# Patient Record
Sex: Female | Born: 1985 | Race: White | Hispanic: No | Smoking: Former smoker
Health system: Southern US, Community
[De-identification: ages and names within clinical notes are randomized; demographics above are authoritative.]

## PROBLEM LIST (undated history)

## (undated) DIAGNOSIS — O039 Complete or unspecified spontaneous abortion without complication: Secondary | ICD-10-CM

## (undated) DIAGNOSIS — F419 Anxiety disorder, unspecified: Secondary | ICD-10-CM

## (undated) DIAGNOSIS — G43909 Migraine, unspecified, not intractable, without status migrainosus: Secondary | ICD-10-CM

## (undated) DIAGNOSIS — N760 Acute vaginitis: Secondary | ICD-10-CM

## (undated) DIAGNOSIS — R112 Nausea with vomiting, unspecified: Secondary | ICD-10-CM

## (undated) DIAGNOSIS — F988 Other specified behavioral and emotional disorders with onset usually occurring in childhood and adolescence: Secondary | ICD-10-CM

## (undated) DIAGNOSIS — B9689 Other specified bacterial agents as the cause of diseases classified elsewhere: Secondary | ICD-10-CM

## (undated) DIAGNOSIS — F32A Depression, unspecified: Secondary | ICD-10-CM

## (undated) DIAGNOSIS — N3010 Interstitial cystitis (chronic) without hematuria: Secondary | ICD-10-CM

## (undated) DIAGNOSIS — Z9289 Personal history of other medical treatment: Secondary | ICD-10-CM

## (undated) DIAGNOSIS — Z9889 Other specified postprocedural states: Secondary | ICD-10-CM

## (undated) DIAGNOSIS — A6000 Herpesviral infection of urogenital system, unspecified: Secondary | ICD-10-CM

## (undated) DIAGNOSIS — R61 Generalized hyperhidrosis: Secondary | ICD-10-CM

## (undated) DIAGNOSIS — R519 Headache, unspecified: Secondary | ICD-10-CM

## (undated) DIAGNOSIS — Z973 Presence of spectacles and contact lenses: Secondary | ICD-10-CM

## (undated) HISTORY — DX: Personal history of other medical treatment: Z92.89

## (undated) HISTORY — DX: Herpesviral infection of urogenital system, unspecified: A60.00

## (undated) HISTORY — DX: Other specified postprocedural states: R11.2

## (undated) HISTORY — DX: Other specified bacterial agents as the cause of diseases classified elsewhere: N76.0

## (undated) HISTORY — DX: Acute vaginitis: B96.89

## (undated) HISTORY — DX: Other specified behavioral and emotional disorders with onset usually occurring in childhood and adolescence: F98.8

## (undated) HISTORY — DX: Complete or unspecified spontaneous abortion without complication: O03.9

## (undated) HISTORY — DX: Other specified postprocedural states: Z98.890

## (undated) HISTORY — DX: Interstitial cystitis (chronic) without hematuria: N30.10

## (undated) HISTORY — DX: Anxiety disorder, unspecified: F41.9

## (undated) HISTORY — DX: Generalized hyperhidrosis: R61

## (undated) HISTORY — PX: WISDOM TOOTH EXTRACTION: SHX21

---
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
## 2007-06-20 DIAGNOSIS — F32A Depression, unspecified: Secondary | ICD-10-CM | POA: Insufficient documentation

## 2007-06-20 DIAGNOSIS — Z720 Tobacco use: Secondary | ICD-10-CM | POA: Insufficient documentation

## 2007-09-25 ENCOUNTER — Ambulatory Visit: Payer: Self-pay

## 2008-02-05 DIAGNOSIS — A6000 Herpesviral infection of urogenital system, unspecified: Secondary | ICD-10-CM

## 2008-02-05 HISTORY — DX: Herpesviral infection of urogenital system, unspecified: A60.00

## 2010-08-26 IMAGING — CT CT HEAD WITHOUT CONTRAST
2 series · 16 of 30 positions shown, 20 images · non-contrast
Comparison: none

REASON FOR EXAM: headache
COMMENTS:

[Series 2: without · axial · non-contrast · 0.40mm/px · z∈[+320,+460]mm · 13 of 34 slices shown, 17 images]
[im 3/34  brain]
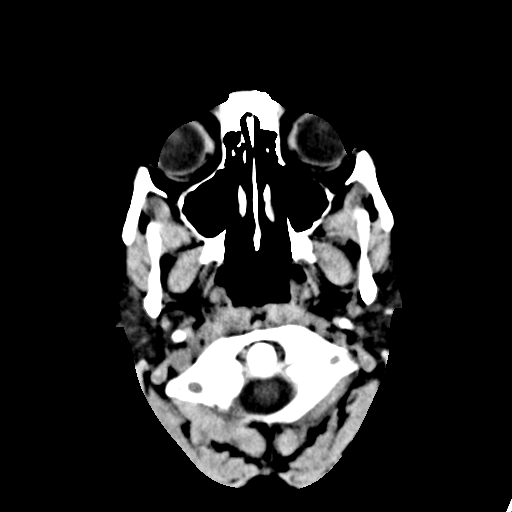
[im 3/34  bone]
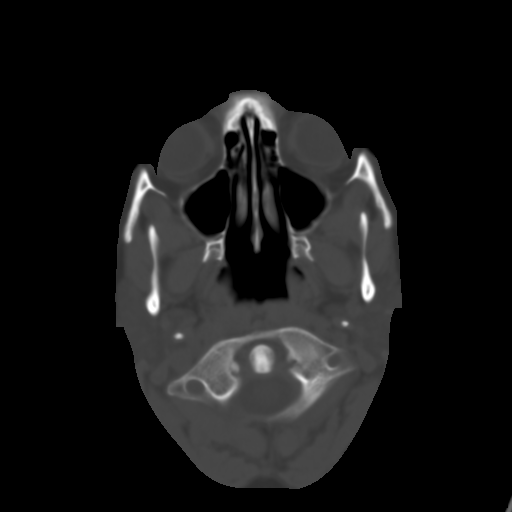
[im 5/34  brain]
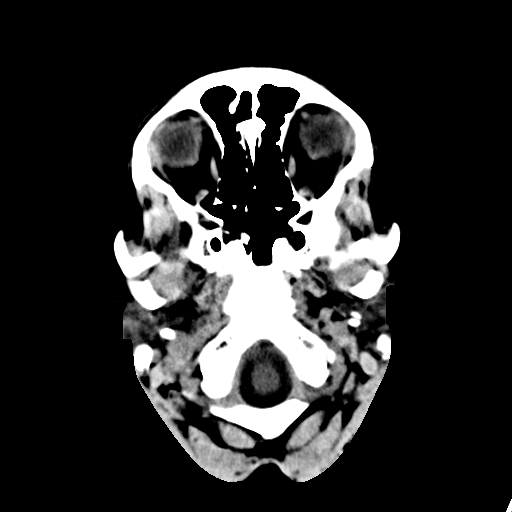
[im 8/34  brain]
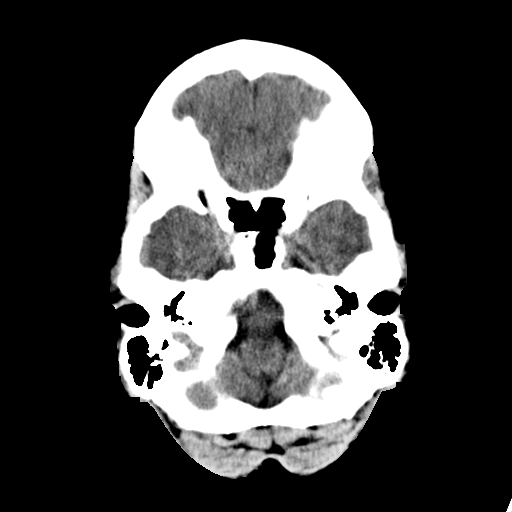
[im 10/34  brain]
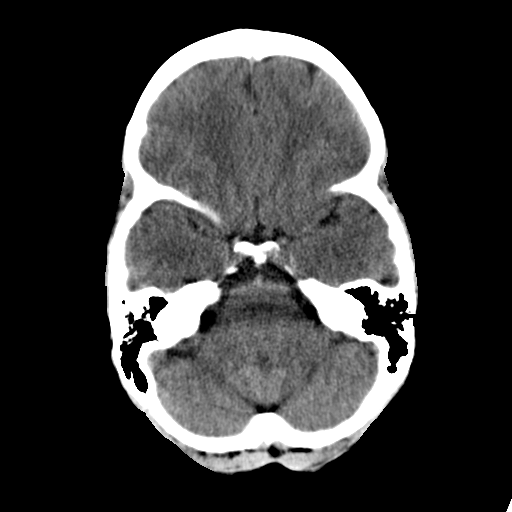
[im 12/34  brain]
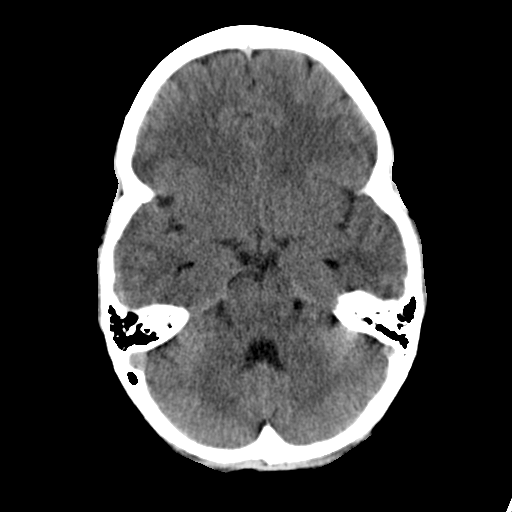
[im 12/34  bone]
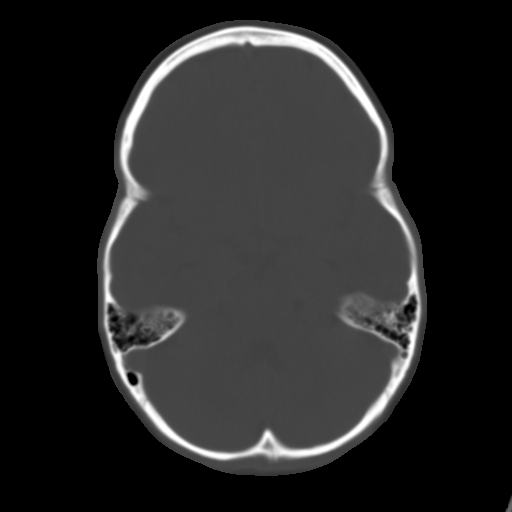
[im 15/34  brain]
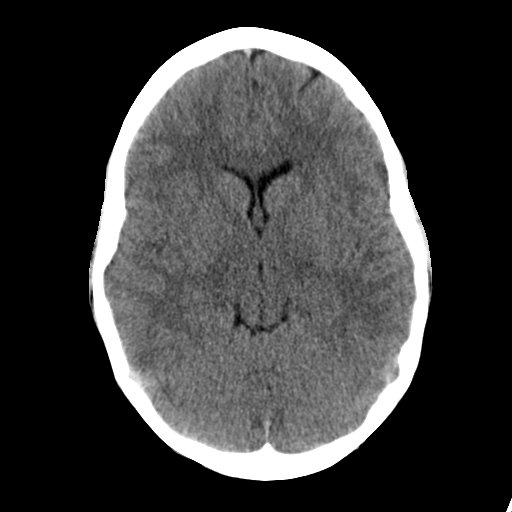
[im 17/34  brain]
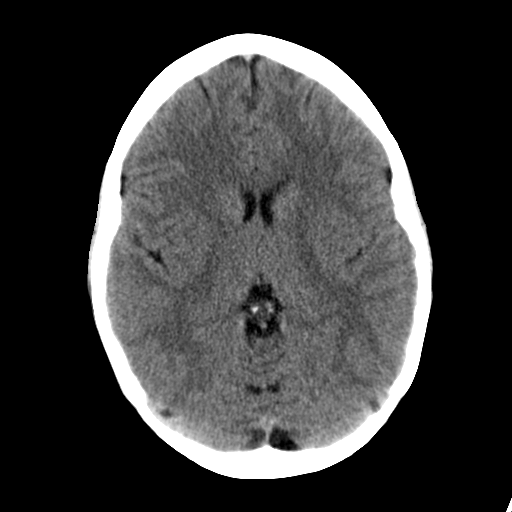
[im 19/34  brain]
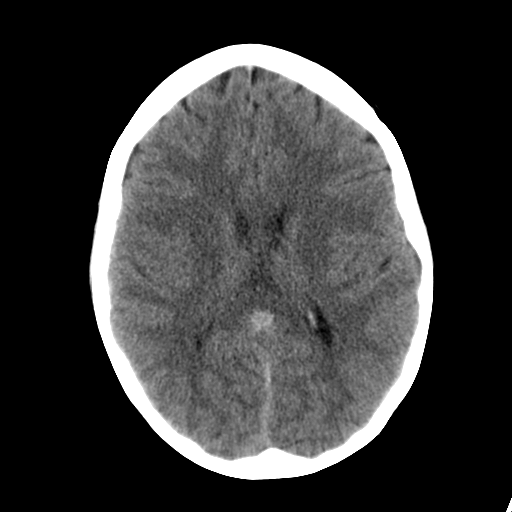
[im 22/34  brain]
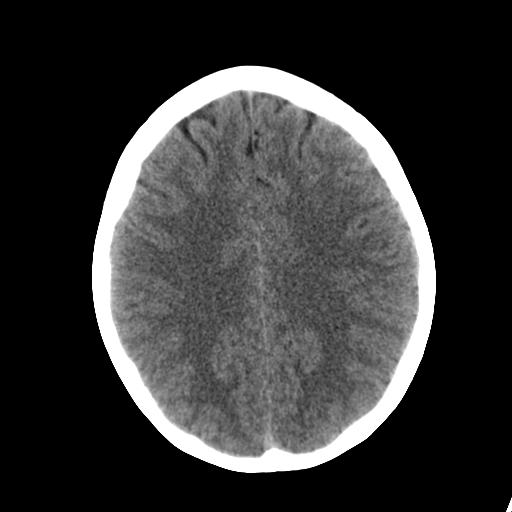
[im 22/34  bone]
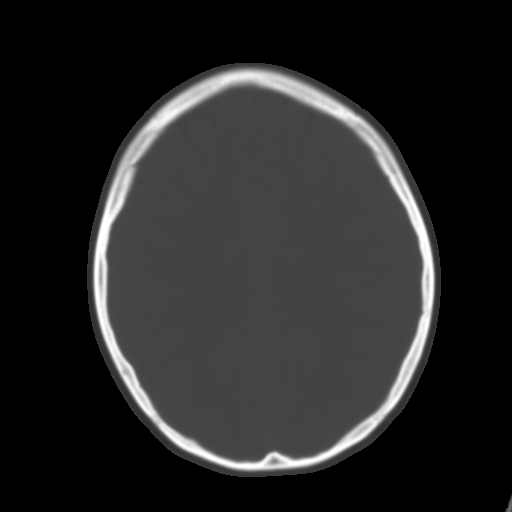
[im 24/34  brain]
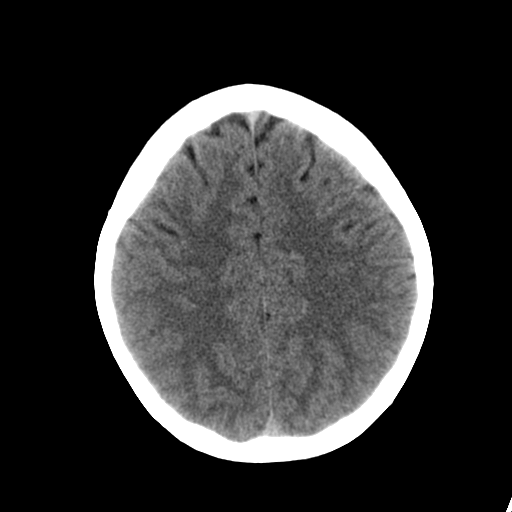
[im 26/34  brain]
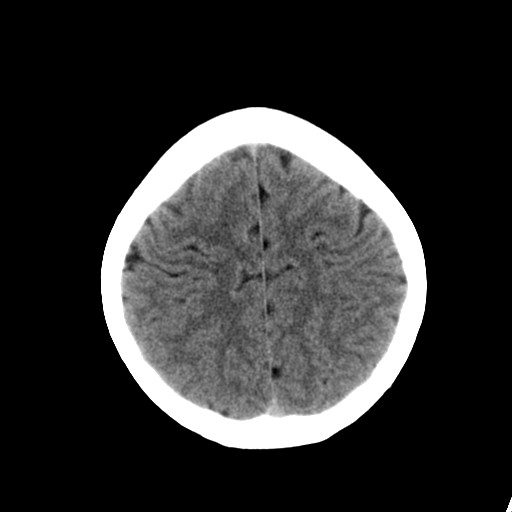
[im 29/34  brain]
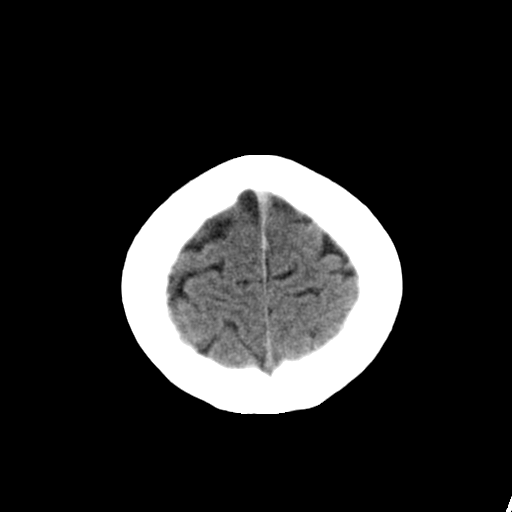
[im 31/34  brain]
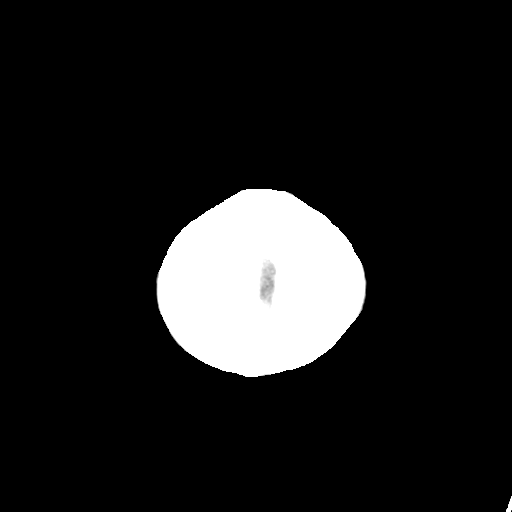
[im 31/34  bone]
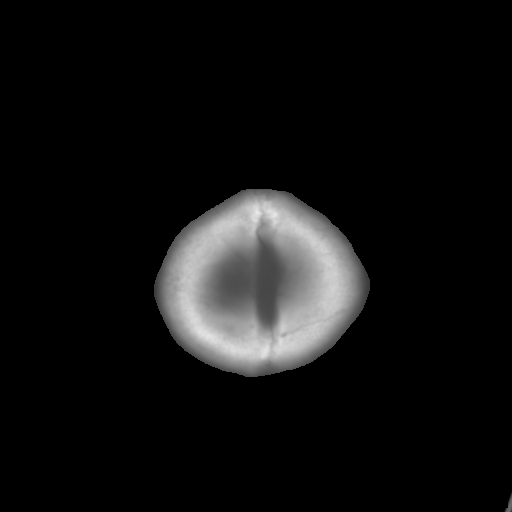

[Series 3: bone · axial · 0.40mm/px · z∈[+320,+366]mm · 3 of 34 slices shown]
[im 3/34  bone]
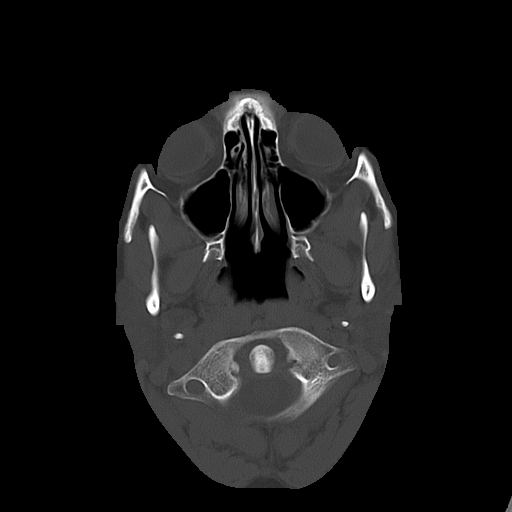
[im 8/34  bone]
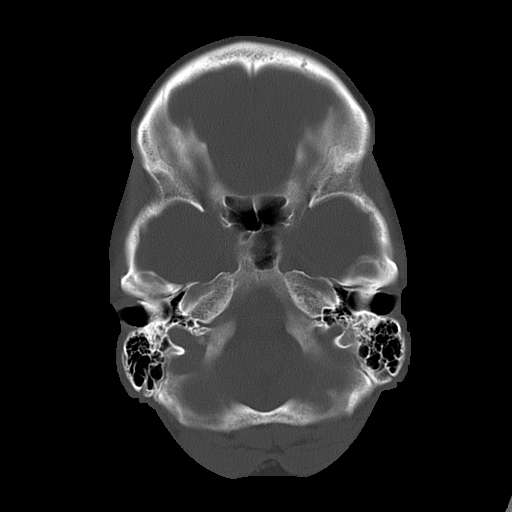
[im 12/34  bone]
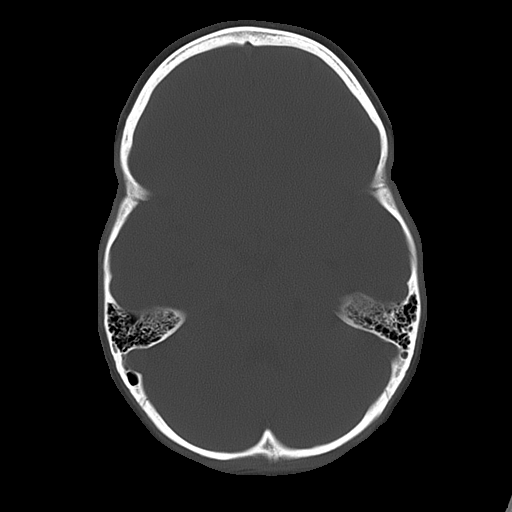

[16 of 30 positions shown; findings below may reference images not displayed]

PROCEDURE:     CT  - CT HEAD WITHOUT CONTRAST  - September 25, 2007  [DATE]

RESULT:     Noncontrast CT of the brain is performed in standard fashion.
There is no previous exam for comparison.

The ventricles and sulci are normal. There is no hemorrhage. There is no
focal mass, mass-effect or midline shift. There is no evidence of edema or
territorial infarct. The bone windows demonstrate normal aeration of the
paranasal sinuses and mastoid air cells. There is no skull fracture
demonstrated.
IMPRESSION: 1. No acute intracranial abnormality.

## 2011-09-03 DIAGNOSIS — N3010 Interstitial cystitis (chronic) without hematuria: Secondary | ICD-10-CM | POA: Insufficient documentation

## 2013-04-18 DIAGNOSIS — R61 Generalized hyperhidrosis: Secondary | ICD-10-CM

## 2013-04-18 HISTORY — DX: Generalized hyperhidrosis: R61

## 2014-10-21 LAB — HM PAP SMEAR: HM Pap smear: NEGATIVE

## 2015-03-15 ENCOUNTER — Encounter: Payer: Self-pay | Admitting: Emergency Medicine

## 2015-03-15 ENCOUNTER — Emergency Department: Payer: BLUE CROSS/BLUE SHIELD

## 2015-03-15 ENCOUNTER — Emergency Department
Admission: EM | Admit: 2015-03-15 | Discharge: 2015-03-15 | Disposition: A | Discharge status: Home / Self Care | Payer: BLUE CROSS/BLUE SHIELD | Attending: Emergency Medicine | Admitting: Emergency Medicine

## 2015-03-15 DIAGNOSIS — K920 Hematemesis: Secondary | ICD-10-CM | POA: Insufficient documentation

## 2015-03-15 DIAGNOSIS — B349 Viral infection, unspecified: Secondary | ICD-10-CM | POA: Insufficient documentation

## 2015-03-15 DIAGNOSIS — F17200 Nicotine dependence, unspecified, uncomplicated: Secondary | ICD-10-CM | POA: Insufficient documentation

## 2015-03-15 DIAGNOSIS — R1110 Vomiting, unspecified: Secondary | ICD-10-CM | POA: Diagnosis present

## 2015-03-15 LAB — COMPREHENSIVE METABOLIC PANEL
ALBUMIN: 5.1 g/dL — AB (ref 3.5–5.0)
ALK PHOS: 56 U/L (ref 38–126)
ALT: 18 U/L (ref 14–54)
ANION GAP: 12 (ref 5–15)
AST: 21 U/L (ref 15–41)
BILIRUBIN TOTAL: 1.1 mg/dL (ref 0.3–1.2)
BUN: 15 mg/dL (ref 6–20)
CALCIUM: 10.1 mg/dL (ref 8.9–10.3)
CO2: 19 mmol/L — ABNORMAL LOW (ref 22–32)
Chloride: 109 mmol/L (ref 101–111)
Creatinine, Ser: 0.8 mg/dL (ref 0.44–1.00)
Glucose, Bld: 111 mg/dL — ABNORMAL HIGH (ref 65–99)
POTASSIUM: 4.1 mmol/L (ref 3.5–5.1)
Sodium: 140 mmol/L (ref 135–145)
TOTAL PROTEIN: 8.2 g/dL — AB (ref 6.5–8.1)

## 2015-03-15 LAB — CBC
HEMATOCRIT: 44.8 % (ref 35.0–47.0)
HEMOGLOBIN: 15.4 g/dL (ref 12.0–16.0)
MCH: 30 pg (ref 26.0–34.0)
MCHC: 34.3 g/dL (ref 32.0–36.0)
MCV: 87.6 fL (ref 80.0–100.0)
Platelets: 322 10*3/uL (ref 150–440)
RBC: 5.12 MIL/uL (ref 3.80–5.20)
RDW: 12.4 % (ref 11.5–14.5)
WBC: 13.1 10*3/uL — AB (ref 3.6–11.0)

## 2015-03-15 LAB — ABO/RH: ABO/RH(D): O POS

## 2015-03-15 LAB — RAPID INFLUENZA A&B ANTIGENS (ARMC ONLY)
INFLUENZA A (ARMC): NEGATIVE
INFLUENZA B (ARMC): NEGATIVE

## 2015-03-15 LAB — TYPE AND SCREEN
ABO/RH(D): O POS
ANTIBODY SCREEN: NEGATIVE

## 2015-03-15 MED ORDER — OMEPRAZOLE 40 MG PO CPDR: 40.0000 mg | DELAYED_RELEASE_CAPSULE | Freq: Every day | ORAL | Status: DC

## 2015-03-15 MED ORDER — ONDANSETRON HCL 4 MG PO TABS
4.0000 mg | ORAL_TABLET | Freq: Once | ORAL | Status: AC
  Administered 2015-03-15: 4 mg via ORAL
  Filled 2015-03-15: qty 1

## 2015-03-15 MED ORDER — PANTOPRAZOLE SODIUM 40 MG PO TBEC
80.0000 mg | DELAYED_RELEASE_TABLET | Freq: Once | ORAL | Status: AC
  Administered 2015-03-15: 80 mg via ORAL
  Filled 2015-03-15: qty 2

## 2015-03-15 MED ORDER — ONDANSETRON HCL 4 MG PO TABS: 4.0000 mg | ORAL_TABLET | Freq: Every day | ORAL | Status: DC | PRN

## 2015-03-15 NOTE — ED Notes (Signed)
Pt to ed with c/o vomiting x 6 today,  Pt reports dark colored red blood in vomit.  Denies diarrhea/

## 2015-03-15 NOTE — ED Provider Notes (Signed)
[facility] Emergency Department Provider Note  ____________________________________________  Time seen: Approximately 6 PM  I have reviewed the triage vital signs and the nursing notes.   HISTORY  Chief Complaint Emesis    HPI Courtney Wyatt is a 30 y.o. female without any chronic medical problems was presenting to the emergency department today with vomiting maroon vomitus 4. She says that she vomited 15-20 cc each time. She started vomiting a dark, maroon vomitus after vomiting clear vomitus 2. She says that this all started this morning and she has not vomited since 2 PM. However, she still feels nauseous. Denies diarrhea. 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 She also admits to nasal congestion and chills. Denies any pain at this time.   History reviewed. No pertinent past medical history.  There are no active problems to display for this patient.   History reviewed. No pertinent past surgical history.  No current outpatient prescriptions on file.  Allergies Review of patient's allergies indicates no known allergies.  History reviewed. No pertinent family history.  Social History Social History  Substance Use Topics  . Smoking status: Current Every Day Smoker  . Smokeless tobacco: None  . Alcohol Use: Yes    Review of Systems Constitutional: chills Eyes: No visual changes. ENT: No sore throat. Cardiovascular: Denies chest pain. Respiratory: Denies shortness of breath. Gastrointestinal: No abdominal pain.    No diarrhea.  No constipation. Genitourinary: Negative for dysuria. Musculoskeletal: Negative for back pain. Skin: Negative for rash. Neurological: Negative for headaches, focal weakness or numbness.  10-point ROS otherwise negative.  ____________________________________________   PHYSICAL EXAM:  VITAL SIGNS: ED Triage Vitals  Enc Vitals Group     BP 03/15/15 1339 138/83 mmHg      Pulse Rate 03/15/15 1339 109     Resp 03/15/15 1339 20     Temp 03/15/15 1339 97.8 F (36.6 C)     Temp Source 03/15/15 1339 Oral     SpO2 03/15/15 1339 95 %     Weight 03/15/15 1339 150 lb (68.04 kg)     Height 03/15/15 1339  (1.6 m)     Head Cir --      Peak Flow --      Pain Score 03/15/15 1340 0     Pain Loc --      Pain Edu? --      Excl. in GC? --     Constitutional: Alert and oriented. Well appearing and in no acute distress. Eyes: Conjunctivae are normal. PERRL. EOMI. Head: Atraumatic. Nose: No congestion/rhinnorhea. Mouth/Throat: Mucous membranes are moist.  Neck: No stridor.   Cardiovascular: Normal rate, regular rhythm. Grossly normal heart sounds.  Good peripheral circulation. Respiratory: Normal respiratory effort.  No retractions. Lungs CTAB. Gastrointestinal: Soft and nontender. No distention.  No CVA tenderness. Musculoskeletal: No lower extremity tenderness nor edema.  No joint effusions. Neurologic:  Normal speech and language. No gross focal neurologic deficits are appreciated.  Skin:  Skin is warm, dry and intact. No rash noted. Psychiatric: Mood and affect are normal. Speech and behavior are normal.  ____________________________________________   LABS (all labs ordered are listed, but only abnormal results are displayed)  Labs Reviewed  COMPREHENSIVE METABOLIC PANEL - Abnormal; Notable for the following:    CO2 19 (*)    Glucose, Bld 111 (*)    Total Protein 8.2 (*)    Albumin 5.1 (*)    All other components  within normal limits  CBC - Abnormal; Notable for the following:    WBC 13.1 (*)    All other components within normal limits  RAPID INFLUENZA A&B ANTIGENS ([facility] ONLY)  TYPE AND SCREEN  ABO/RH   ____________________________________________  EKG   ____________________________________________  RADIOLOGY  IMPRESSION: No active cardiopulmonary disease.   Electronically Signed By: Charlett NoseKevin Dover M.D. On: 03/15/2015 19:05        ____________________________________________   PROCEDURES  ____________________________________________   INITIAL IMPRESSION / ASSESSMENT AND PLAN / ED COURSE  Pertinent labs & imaging results that were available during my care of the patient were reviewed by me and considered in my medical decision making (see chart for details).  ----------------------------------------- 7:44 PM on 03/15/2015 -----------------------------------------  Patient resting comfortably at this time was able take by mouth meds as well as sips of water and tolerated them. Only mild nausea. Denies any pain. Has had no further episodes of vomiting. Unclear cause of the vomiting blood but it is suspecting she may have had a mild Mallory-Weiss tear versus gastric irritation. Will be discharged on omeprazole. Likely viral illness as the etiology of the overall reason for visit. ____________________________________________   FINAL CLINICAL IMPRESSION(S) / ED DIAGNOSES  Hematemesis. Viral illness.    Myrna Blazeravid Matthew Berline Semrad, MD 03/15/15 [id number]

## 2015-05-16 DIAGNOSIS — F331 Major depressive disorder, recurrent, moderate: Secondary | ICD-10-CM | POA: Diagnosis not present

## 2015-07-24 ENCOUNTER — Other Ambulatory Visit: Payer: Self-pay | Admitting: Family Medicine

## 2015-07-24 ENCOUNTER — Other Ambulatory Visit: Payer: Self-pay

## 2015-07-24 ENCOUNTER — Ambulatory Visit (INDEPENDENT_AMBULATORY_CARE_PROVIDER_SITE_OTHER): Payer: BLUE CROSS/BLUE SHIELD | Admitting: Family Medicine

## 2015-07-24 ENCOUNTER — Encounter: Payer: Self-pay | Admitting: Family Medicine

## 2015-07-24 VITALS — BP 122/62 | HR 88 | Temp 98.3°F | Resp 16 | Ht 63.0 in | Wt 149.0 lb

## 2015-07-24 DIAGNOSIS — F419 Anxiety disorder, unspecified: Secondary | ICD-10-CM | POA: Insufficient documentation

## 2015-07-24 DIAGNOSIS — Z111 Encounter for screening for respiratory tuberculosis: Secondary | ICD-10-CM | POA: Diagnosis not present

## 2015-07-24 DIAGNOSIS — Z915 Personal history of self-harm: Secondary | ICD-10-CM | POA: Insufficient documentation

## 2015-07-24 DIAGNOSIS — Z9151 Personal history of suicidal behavior: Secondary | ICD-10-CM

## 2015-07-24 DIAGNOSIS — N3090 Cystitis, unspecified without hematuria: Secondary | ICD-10-CM | POA: Diagnosis not present

## 2015-07-24 HISTORY — DX: Personal history of suicidal behavior: Z91.51

## 2015-07-24 LAB — POCT URINALYSIS DIPSTICK
Bilirubin, UA: NEGATIVE
Glucose, UA: NEGATIVE
Ketones, UA: NEGATIVE
LEUKOCYTES UA: NEGATIVE
NITRITE UA: NEGATIVE
PH UA: 7.5
PROTEIN UA: NEGATIVE
Spec Grav, UA: 1.02
UROBILINOGEN UA: 0.2

## 2015-07-24 MED ORDER — NITROFURANTOIN MONOHYD MACRO 100 MG PO CAPS: 100.0000 mg | ORAL_CAPSULE | Freq: Two times a day (BID) | ORAL | Status: DC

## 2015-07-24 NOTE — Patient Instructions (Signed)
We will call you with the culture results 

## 2015-07-24 NOTE — Progress Notes (Signed)
Subjective:     Patient ID: Courtney Wyatt, female   DOB: Dec 08, 1985, 30 y.o.   MRN: [id number]  HPI  Chief Complaint  Patient presents with  . Urinary Tract Infection    X 2-3 days. Patient reports that she has chronic interstitial cystitis and this is the first time in a while that she has had burning on urination.   States she has not needed Elmiron in several years. Reports odor and urgency but no fever/chills. Urologist is Dr.Cope.    Review of Systems     Objective:   Physical Exam  Constitutional: She appears well-developed and well-nourished. No distress.  Genitourinary:  No CVA tenderness       Assessment:    1. Cystitis - POCT urinalysis dipstick - Urine culture - nitrofurantoin, macrocrystal-monohydrate, (MACROBID) 100 MG capsule; Take 1 capsule (100 mg total) by mouth 2 (two) times daily.  Dispense: 14 capsule; Refill: 0  2. Screening for tuberculosis - Quantiferon tb gold assay (blood)    Plan:    We will call with the culture results.

## 2015-07-26 LAB — URINE CULTURE

## 2015-07-30 ENCOUNTER — Telehealth: Payer: Self-pay

## 2015-07-30 LAB — QUANTIFERON TB GOLD ASSAY (BLOOD)

## 2015-07-30 LAB — QUANTIFERON IN TUBE
QFT TB AG MINUS NIL VALUE: 0 IU/mL
QUANTIFERON MITOGEN VALUE: >10 IU/mL
QUANTIFERON TB AG VALUE: 0.02 IU/mL
QUANTIFERON TB GOLD: NEGATIVE
Quantiferon Nil Value: 0.02 IU/mL

## 2015-07-30 NOTE — Telephone Encounter (Signed)
Contacted patient and advised her of urine culture report and TB screen. A copy of lab results has been left at the front desk per patient request. Renette Butters

## 2015-08-18 DIAGNOSIS — F331 Major depressive disorder, recurrent, moderate: Secondary | ICD-10-CM | POA: Diagnosis not present

## 2015-11-12 DIAGNOSIS — F331 Major depressive disorder, recurrent, moderate: Secondary | ICD-10-CM | POA: Diagnosis not present

## 2015-12-16 DIAGNOSIS — N760 Acute vaginitis: Secondary | ICD-10-CM | POA: Diagnosis not present

## 2015-12-16 DIAGNOSIS — N898 Other specified noninflammatory disorders of vagina: Secondary | ICD-10-CM | POA: Diagnosis not present

## 2016-04-27 ENCOUNTER — Telehealth: Payer: Self-pay | Admitting: Family Medicine

## 2016-04-27 ENCOUNTER — Ambulatory Visit (INDEPENDENT_AMBULATORY_CARE_PROVIDER_SITE_OTHER): Payer: BLUE CROSS/BLUE SHIELD | Admitting: Family Medicine

## 2016-04-27 ENCOUNTER — Encounter: Payer: Self-pay | Admitting: Family Medicine

## 2016-04-27 VITALS — BP 120/80 | HR 80 | Temp 99.1°F | Resp 16 | Wt 155.8 lb

## 2016-04-27 DIAGNOSIS — R42 Dizziness and giddiness: Secondary | ICD-10-CM

## 2016-04-27 MED ORDER — ONDANSETRON HCL 4 MG PO TABS: 4.0000 mg | ORAL_TABLET | Freq: Three times a day (TID) | ORAL | 0 refills | Status: DC | PRN

## 2016-04-27 MED ORDER — MECLIZINE HCL 25 MG PO TABS: 25.0000 mg | ORAL_TABLET | Freq: Three times a day (TID) | ORAL | 0 refills | Status: DC | PRN

## 2016-04-27 NOTE — Telephone Encounter (Signed)
Pharmacy called saying there drug interaction with 2 of the prescriptions that the patient is prescribed.  Citalopram and Meclizine?  Please advise.  [phone]  Thanks Barth Kirks

## 2016-04-27 NOTE — Patient Instructions (Signed)
Call if not improving over the next few days for ENT referral.

## 2016-04-27 NOTE — Progress Notes (Signed)
Subjective:     Patient ID: Courtney Wyatt   DOB: 01-14-1985, 31 y.o.   MRN: [id number]  HPI  Chief Complaint  Patient presents with  . Dizziness    Patient comes in office today with concerns of vertigo, patient reports that she was in [place] from 4/18/-4/22. On 04/24/16 patient states that she went sailing when onset of symptom began, she states that she has taken otc dramamine and motion sickness patch but had little relief. Patient reports that she did go swimming while in [place] but does not believe that it caused symptom.   States she used a scopolamine patch for 24 hours from 4/21-4/22. States her feeling is  "I am moving around" and that her environment is not spinning. Reports she has history of motion sickness.   Review of Systems     Objective:   Physical Exam  Constitutional: She appears well-developed and well-nourished. No distress.  HENT:  Right Ear: Tympanic membrane normal.  Left Ear: Tympanic membrane normal.  Eyes: EOM are normal. Pupils are equal, round, and reactive to light. Right eye exhibits no nystagmus. Left eye exhibits no nystagmus.  Neurological: Coordination (Romberg negative, Finger to nose and heel to toe WNL) normal.       Assessment:    1. Dizziness; ? Anticholinergic toxicity ? vestibular - ondansetron (ZOFRAN) 4 MG tablet; Take 1 tablet (4 mg total) by mouth every 8 (eight) hours as needed for nausea or vomiting.  Dispense: 6 tablet; Refill: 0 - meclizine (ANTIVERT) 25 MG tablet; Take 1 tablet (25 mg total) by mouth 3 (three) times daily as needed for dizziness.  Dispense: 12 tablet; Refill: 0    Plan:    Monitor and refer to ENT if not improving over the next few days.

## 2016-04-28 ENCOUNTER — Telehealth: Payer: Self-pay | Admitting: Family Medicine

## 2016-04-28 DIAGNOSIS — R42 Dizziness and giddiness: Secondary | ICD-10-CM

## 2016-04-28 NOTE — Telephone Encounter (Signed)
Can refer to ENT to evaluate for vertigo. Did she get meclizine prescription filled?   Also, OK to fill Zofran, but she should reduce citalopram to 1/2 tablet daily until  Better.

## 2016-04-28 NOTE — Telephone Encounter (Signed)
Pharmacy noted possible serotonin increase with citalopram and odansetron. Have call out to patient to see how her dizziness is doing.

## 2016-04-28 NOTE — Telephone Encounter (Signed)
Yes she did get meclizine prescription and has been taking it, will notify pharmacy and put in order for referral. Thank You -KW

## 2016-04-28 NOTE — Telephone Encounter (Signed)
Pt states she is not feeling any better.  Pt is still having dizziness.  Pt is request a referral to see a specialist.  CB#[phone]/MW

## 2016-04-28 NOTE — Telephone Encounter (Signed)
Please review and advise. KW 

## 2016-04-28 NOTE — Telephone Encounter (Signed)
Called pharmacist to approve prescription and left voicemail message for patient telling her to take 1/2 tablet of Celexa daily until symptoms subside while taking Zofran.KW

## 2016-04-28 NOTE — Telephone Encounter (Signed)
Spoke with patient on the phone she states that dizziness has been more intense today and she still has nausea and headache. She states that she did not have Zofran filled because pharmacist at [facility] said it would interact with Celexa. Patient denies vomiting, sweats/chills, visual disturbance, tremors, numbness, chest pain, shortness of breath fever, palpitations or fatigue. Patient states the dramamine is not helping. Patient has been advised that Nadine Counts is out of the office till AM, I spoke with Theme park manager) and she states in the system it showed there was an interaction between medications but could not specify what exactly was interaction risk. Please review chart.  Thanks-KW

## 2016-05-13 ENCOUNTER — Ambulatory Visit: Payer: Self-pay | Admitting: Obstetrics and Gynecology

## 2016-06-14 ENCOUNTER — Encounter: Payer: Self-pay | Admitting: Obstetrics and Gynecology

## 2016-06-14 ENCOUNTER — Ambulatory Visit (INDEPENDENT_AMBULATORY_CARE_PROVIDER_SITE_OTHER): Payer: BLUE CROSS/BLUE SHIELD | Admitting: Obstetrics and Gynecology

## 2016-06-14 VITALS — BP 102/62 | HR 70 | Ht 63.0 in | Wt 158.0 lb

## 2016-06-14 DIAGNOSIS — F419 Anxiety disorder, unspecified: Secondary | ICD-10-CM

## 2016-06-14 DIAGNOSIS — Z3169 Encounter for other general counseling and advice on procreation: Secondary | ICD-10-CM

## 2016-06-14 DIAGNOSIS — Z01419 Encounter for gynecological examination (general) (routine) without abnormal findings: Secondary | ICD-10-CM | POA: Diagnosis not present

## 2016-06-14 DIAGNOSIS — Z124 Encounter for screening for malignant neoplasm of cervix: Secondary | ICD-10-CM | POA: Diagnosis not present

## 2016-06-14 NOTE — Progress Notes (Signed)
Chief Complaint  Patient presents with  . Gynecologic Exam     HPI:      Ms. Courtney Wyatt is a 31 y.o. G0P0000 who LMP was Patient's last menstrual period was 05/29/2016 (exact date)., presents today for her annual examination.  Her menses are every 3-4 wks, lasting 6 days.  Dysmenorrhea mild, occurring first 1-2 days of flow. She does not have intermenstrual bleeding.  Sex activity: single partner, contraception - none. She is trying to conceive since return of normal menses after depo 4/18. She is taking PNVs. 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 There is no FH of ovarian cancer. The patient does not do self-breast exams.  Tobacco use: The patient denies current or previous tobacco use. Alcohol use: social drinker Exercise: moderately active  She does get adequate calcium and Vitamin D in her diet. She takes celexa 20 mg for anxiety with sx relief. She has d/c'd the concerta.    Past Medical History:  Diagnosis Date  . ADD (attention deficit disorder)   . Anxiety   . Bacterial vaginosis   . 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 History of Papanicolaou smear of cervix 07/10/2013; 10/21/2014   NEG; NEG  . 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 Interstitial cystitis     Past Surgical History:  Procedure Laterality Date  . LAPAROSCOPY  04/2004  . TONSILLECTOMY  1994   DR.  Jerral Bonito    Family History  Problem Relation Age of Onset  . Hyperlipidemia Mother   . Depression Mother   . Breast cancer Maternal Aunt 65    Social History   Social History  . Marital status: Married    Spouse name: N/A  . Number of children: 0  . Years of education: N/A   Occupational History  . Not on file.   Social History Main Topics  . Smoking status: Former Smoker    Quit date: 10/21/2015  . Smokeless tobacco: Never Used  . Alcohol use Yes  .  Drug use: No  . Sexual activity: Yes    Birth control/ protection: None   Other Topics Concern  . Not on file   Social History Narrative  . No narrative on file     Current Outpatient Prescriptions:  .  citalopram (CELEXA) 20 MG tablet, Take by mouth., Disp: , Rfl:  .  Prenatal Vit-Fe Fumarate-FA (MULTIVITAMIN-PRENATAL) 27-0.8 MG TABS tablet, Take 1 tablet by mouth daily at 12 noon., Disp: , Rfl:   ROS:  Review of Systems  Constitutional: Negative for fatigue, fever and unexpected weight change.  Respiratory: Negative for cough, shortness of breath and wheezing.   Cardiovascular: Negative for chest pain, palpitations and leg swelling.  Gastrointestinal: Positive for constipation. Negative for blood in stool, diarrhea, nausea and vomiting.  Endocrine: Negative for cold intolerance, heat intolerance and polyuria.  Genitourinary: Positive for vaginal discharge. Negative for dyspareunia, dysuria, flank pain, frequency, genital sores, hematuria, menstrual problem, pelvic pain, urgency, vaginal bleeding and vaginal pain.  Musculoskeletal: Negative for back pain, joint swelling and myalgias.  Skin: Negative for rash.  Neurological: Negative for dizziness, syncope, light-headedness, numbness and headaches.  Hematological: Negative for adenopathy.  Psychiatric/Behavioral: Negative for agitation, confusion, sleep disturbance and suicidal ideas. The patient is not nervous/anxious.      Objective: BP 102/62  Pulse 70   Ht '5\' 3"'  (1.6 m)   Wt 158 lb (71.7 kg)   LMP 05/29/2016 (Exact Date)   BMI 27.99 kg/m    Physical Exam  Constitutional: She is oriented to person, place, and time. She appears well-developed and well-nourished.  Genitourinary: Vagina normal and uterus normal. There is no rash or tenderness on the right labia. There is no rash or tenderness on the left labia. No erythema or tenderness in the vagina. No vaginal discharge found. Right adnexum does not display mass and  does not display tenderness. Left adnexum does not display mass and does not display tenderness. Cervix does not exhibit motion tenderness or polyp. Uterus is not enlarged or tender.  Neck: Normal range of motion. No thyromegaly present.  Cardiovascular: Normal rate, regular rhythm and normal heart sounds.   No murmur heard. Pulmonary/Chest: Effort normal and breath sounds normal. Right breast exhibits no mass, no nipple discharge, no skin change and no tenderness. Left breast exhibits no mass, no nipple discharge, no skin change and no tenderness.  Abdominal: Soft. There is no tenderness. There is no guarding.  Musculoskeletal: Normal range of motion.  Neurological: She is alert and oriented to person, place, and time. No cranial nerve deficit.  Psychiatric: She has a normal mood and affect. Her behavior is normal.  Vitals reviewed.   Assessment/Plan: Encounter for annual routine gynecological examination  Cervical cancer screening - Plan: IGP, rfx Aptima HPV ASCU  Pre-conception counseling - Cont PNVs. Can try OTC urine ovulation pred kit. F/u 10/18 if no conception.   Anxiety - Pt doing well on celexa. Ok to take if conceives.             GYN counsel adequate intake of calcium and vitamin D     F/U  Return in about 1 year (around 06/14/2017).  Chantal Worthey B. Kynslie Ringle, PA-C 06/14/2016 4:31 PM

## 2016-06-16 LAB — IGP, RFX APTIMA HPV ASCU: PAP SMEAR COMMENT: 0

## 2016-07-14 DIAGNOSIS — O039 Complete or unspecified spontaneous abortion without complication: Secondary | ICD-10-CM

## 2016-07-14 HISTORY — DX: Complete or unspecified spontaneous abortion without complication: O03.9

## 2016-10-12 ENCOUNTER — Encounter: Payer: Self-pay | Admitting: Obstetrics and Gynecology

## 2016-10-12 ENCOUNTER — Ambulatory Visit (INDEPENDENT_AMBULATORY_CARE_PROVIDER_SITE_OTHER): Payer: BLUE CROSS/BLUE SHIELD | Admitting: Obstetrics and Gynecology

## 2016-10-12 VITALS — BP 120/70 | HR 93 | Ht 63.0 in | Wt 154.0 lb

## 2016-10-12 DIAGNOSIS — N898 Other specified noninflammatory disorders of vagina: Secondary | ICD-10-CM | POA: Diagnosis not present

## 2016-10-12 NOTE — Progress Notes (Signed)
   Chief Complaint  Patient presents with  . Vaginal Pain    bump in vaginal area    HPI:      Ms. Courtney Wyatt is a 31 y.o. G1P0010 who LMP was Patient's last menstrual period was 09/21/2016 (exact date)., presents today for vaginal bump since yesterday. Feels like a little ball. Area is not itchy or painful. She did have some vaginal itching last wk that resolved. No vag d/c, odor. Hx of HSV 1 2010. No recurrent sx. Pt is concerned.    Past Medical History:  Diagnosis Date  . ADD (attention deficit disorder)   . Anxiety   . Bacterial vaginosis   . 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 History of Papanicolaou smear of cervix 07/10/2013; 10/21/2014   NEG; NEG  . 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 Interstitial cystitis   . SAB (spontaneous abortion) 07/14/2016    Past Surgical History:  Procedure Laterality Date  . LAPAROSCOPY  04/2004  . TONSILLECTOMY  1994   DR.  Colonel Bald    Family History  Problem Relation Age of Onset  . Hyperlipidemia Mother   . Depression Mother   . Breast cancer Maternal Aunt 57    Social History   Social History  . Marital status: Married    Spouse name: N/A  . Number of children: 0  . Years of education: N/A   Occupational History  . Not on file.   Social History Main Topics  . Smoking status: Former Smoker    Quit date: 10/21/2015  . Smokeless tobacco: Never Used  . Alcohol use Yes  . Drug use: No  . Sexual activity: Yes    Birth control/ protection: None   Other Topics Concern  . Not on file   Social History Narrative  . No narrative on file     Current Outpatient Prescriptions:  .  Prenatal Vit-Fe Fumarate-FA (MULTIVITAMIN-PRENATAL) 27-0.8 MG TABS tablet, Take 1 tablet by mouth daily at 12 noon., Disp: , Rfl:    ROS:  Review of Systems  Constitutional: Negative for fever.  Gastrointestinal: Negative for blood in stool, constipation, diarrhea, nausea and vomiting.  Genitourinary: Positive for genital sores.  Negative for dyspareunia, dysuria, flank pain, frequency, hematuria, urgency, vaginal bleeding, vaginal discharge and vaginal pain.  Musculoskeletal: Negative for back pain.  Skin: Negative for rash.     OBJECTIVE:   Vitals:  BP 120/70   Pulse 93   Ht  (1.6 m)   Wt 154 lb (69.9 kg)   LMP 09/21/2016 (Exact Date)   BMI 27.28 kg/m   Physical Exam  Constitutional: She is oriented to person, place, and time and well-developed, well-nourished, and in no distress.  Genitourinary: Vulva exhibits lesion. Vulva exhibits no erythema, no exudate, no rash and no tenderness.  Genitourinary Comments: ~5 MM FIRM, MOBILE MASS LT LABIA MINORA NEAR CLITORIS; FEELS LIKE CYST   Neurological: She is alert and oriented to person, place, and time.  Psychiatric: Memory, affect and judgment normal.  Vitals reviewed.   Assessment/Plan: Vaginal lesion - Lt labia minora. Either little sebaceous cyst vs clogged duct. F/u prn. Reassurance.    Return if symptoms worsen or fail to improve.  Corina Stacy B. Tulsi Crossett, PA-C 10/12/2016 4:59 PM

## 2016-11-26 ENCOUNTER — Ambulatory Visit: Payer: BLUE CROSS/BLUE SHIELD | Admitting: Family Medicine

## 2016-11-26 ENCOUNTER — Encounter: Payer: Self-pay | Admitting: Family Medicine

## 2016-11-26 VITALS — BP 122/66 | HR 74 | Temp 98.2°F | Wt 161.4 lb

## 2016-11-26 DIAGNOSIS — R300 Dysuria: Secondary | ICD-10-CM | POA: Diagnosis not present

## 2016-11-26 LAB — POCT URINALYSIS DIPSTICK
BILIRUBIN UA: NEGATIVE
GLUCOSE UA: NEGATIVE
Ketones, UA: NEGATIVE
Leukocytes, UA: NEGATIVE
NITRITE UA: NEGATIVE
PH UA: 6 (ref 5.0–8.0)
Protein, UA: NEGATIVE
RBC UA: NEGATIVE
UROBILINOGEN UA: 0.2 U/dL

## 2016-11-26 NOTE — Progress Notes (Signed)
Patient: Courtney LlanoDeana G Papania Female    DOB: [birth year]   31 y.o.   MRN: [id number] Visit Date: 11/26/2016  Today's Provider: Dortha Kernennis Chrismon, PA   Chief Complaint  Patient presents with  . Urinary Tract Infection   Subjective:    Urinary Tract Infection   This is a new problem. Episode onset: last week. The problem occurs intermittently. Progression since onset: pain has improved but patient reports urine odor. The patient is experiencing no pain. There has been no fever. Associated symptoms comments: Urine odor . She has tried nothing for the symptoms. cystitis    Past Medical History:  Diagnosis Date  . ADD (attention deficit disorder)   . Anxiety   . Bacterial vaginosis   . 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 History of Papanicolaou smear of cervix 07/10/2013; 10/21/2014   NEG; NEG  . 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 Interstitial cystitis   . SAB (spontaneous abortion) 07/14/2016   Past Surgical History:  Procedure Laterality Date  . LAPAROSCOPY  04/2004  . TONSILLECTOMY  1994   DR.  Colonel BaldJEUNGLE   Family History  Problem Relation Age of Onset  . Hyperlipidemia Mother   . Depression Mother   . Breast cancer Maternal Aunt 65   No Known Allergies  Current Outpatient Medications:  .  Prenatal Vit-Fe Fumarate-FA (MULTIVITAMIN-PRENATAL) 27-0.8 MG TABS tablet, Take 1 tablet by mouth daily at 12 noon., Disp: , Rfl:   Review of Systems  Constitutional: Negative.   Respiratory: Negative.   Cardiovascular: Negative.   Genitourinary: Negative.     Social History   Tobacco Use  . Smoking status: Former Smoker    Last attempt to quit: 10/21/2015    Years since quitting: 1.1  . Smokeless tobacco: Never Used  Substance Use Topics  . Alcohol use: Yes   Objective:   BP 122/66 (BP Location: Right Arm, Patient Position: Sitting, Cuff Size: Normal)   Pulse 74   Temp 98.2 F (36.8 C) (Oral)   Wt 161 lb 6.4 oz (73.2 kg)   SpO2 99%   BMI 28.59 kg/m    Physical Exam   Constitutional: She is oriented to person, place, and time. She appears well-developed and well-nourished. No distress.  HENT:  Head: Normocephalic and atraumatic.  Right Ear: Hearing normal.  Left Ear: Hearing normal.  Nose: Nose normal.  Eyes: Conjunctivae and lids are normal. Right eye exhibits no discharge. Left eye exhibits no discharge. No scleral icterus.  Cardiovascular: Normal rate and regular rhythm.  Pulmonary/Chest: Effort normal. No respiratory distress.  Abdominal: Soft. Bowel sounds are normal. There is no tenderness. There is no guarding.  Musculoskeletal: Normal range of motion.  Neurological: She is alert and oriented to person, place, and time.  Skin: Skin is intact. No lesion and no rash noted.  Psychiatric: She has a normal mood and affect. Her speech is normal and behavior is normal. Thought content normal.      Assessment & Plan:     1. Dysuria Onset last week during menses. Similar as past interstitial cystitis symptoms. No hematuria and no discomfort today. Symptoms seemed to be controlled by Depo Provera for birth control in the past. Stopped the Depo Provera in an attempt to get pregnant over the past year (had one miscarrage). Urinalysis clear today. Suspect recurrence of interstitial cystitis symptoms. Increase fluid intake and may use AZO-Standard prn. Recheck as needed. - POCT Urinalysis Dipstick       Maurine Ministerennis  [place], [facility]

## 2017-01-11 DIAGNOSIS — N911 Secondary amenorrhea: Secondary | ICD-10-CM | POA: Diagnosis not present

## 2017-01-21 DIAGNOSIS — Z3685 Encounter for antenatal screening for Streptococcus B: Secondary | ICD-10-CM | POA: Diagnosis not present

## 2017-01-21 DIAGNOSIS — Z3401 Encounter for supervision of normal first pregnancy, first trimester: Secondary | ICD-10-CM | POA: Diagnosis not present

## 2017-01-21 LAB — OB RESULTS CONSOLE GC/CHLAMYDIA
Chlamydia: NEGATIVE
GC PROBE AMP, GENITAL: NEGATIVE

## 2017-01-21 LAB — OB RESULTS CONSOLE HEPATITIS B SURFACE ANTIGEN: Hepatitis B Surface Ag: NEGATIVE

## 2017-01-21 LAB — OB RESULTS CONSOLE RPR: RPR: NONREACTIVE

## 2017-01-21 LAB — OB RESULTS CONSOLE HIV ANTIBODY (ROUTINE TESTING): HIV: NONREACTIVE

## 2017-01-21 LAB — OB RESULTS CONSOLE ANTIBODY SCREEN: Antibody Screen: NEGATIVE

## 2017-01-21 LAB — OB RESULTS CONSOLE RUBELLA ANTIBODY, IGM: Rubella: IMMUNE

## 2017-01-21 LAB — OB RESULTS CONSOLE ABO/RH: RH TYPE: POSITIVE

## 2017-02-01 DIAGNOSIS — Z3400 Encounter for supervision of normal first pregnancy, unspecified trimester: Secondary | ICD-10-CM | POA: Diagnosis not present

## 2017-02-01 DIAGNOSIS — Z113 Encounter for screening for infections with a predominantly sexual mode of transmission: Secondary | ICD-10-CM | POA: Diagnosis not present

## 2017-02-01 DIAGNOSIS — Z3480 Encounter for supervision of other normal pregnancy, unspecified trimester: Secondary | ICD-10-CM | POA: Diagnosis not present

## 2017-02-01 DIAGNOSIS — Z3A11 11 weeks gestation of pregnancy: Secondary | ICD-10-CM | POA: Diagnosis not present

## 2017-03-29 DIAGNOSIS — Z3A19 19 weeks gestation of pregnancy: Secondary | ICD-10-CM | POA: Diagnosis not present

## 2017-03-29 DIAGNOSIS — Z363 Encounter for antenatal screening for malformations: Secondary | ICD-10-CM | POA: Diagnosis not present

## 2017-05-24 DIAGNOSIS — Z23 Encounter for immunization: Secondary | ICD-10-CM | POA: Diagnosis not present

## 2017-05-24 DIAGNOSIS — Z3480 Encounter for supervision of other normal pregnancy, unspecified trimester: Secondary | ICD-10-CM | POA: Diagnosis not present

## 2017-05-31 DIAGNOSIS — Z3A28 28 weeks gestation of pregnancy: Secondary | ICD-10-CM | POA: Diagnosis not present

## 2017-05-31 DIAGNOSIS — O99810 Abnormal glucose complicating pregnancy: Secondary | ICD-10-CM | POA: Diagnosis not present

## 2017-07-28 DIAGNOSIS — Z3480 Encounter for supervision of other normal pregnancy, unspecified trimester: Secondary | ICD-10-CM | POA: Diagnosis not present

## 2017-07-28 DIAGNOSIS — Z3685 Encounter for antenatal screening for Streptococcus B: Secondary | ICD-10-CM | POA: Diagnosis not present

## 2017-08-13 ENCOUNTER — Inpatient Hospital Stay (HOSPITAL_COMMUNITY)
Admission: AD | Admit: 2017-08-13 | Payer: BLUE CROSS/BLUE SHIELD | Source: Ambulatory Visit | Admitting: Obstetrics & Gynecology

## 2017-08-18 ENCOUNTER — Telehealth (HOSPITAL_COMMUNITY): Payer: Self-pay | Admitting: *Deleted

## 2017-08-18 ENCOUNTER — Encounter (HOSPITAL_COMMUNITY): Payer: Self-pay | Admitting: *Deleted

## 2017-08-18 LAB — OB RESULTS CONSOLE GBS: STREP GROUP B AG: NEGATIVE

## 2017-08-18 NOTE — Telephone Encounter (Signed)
Preadmission screen  

## 2017-08-23 ENCOUNTER — Encounter (HOSPITAL_COMMUNITY): Payer: Self-pay | Admitting: *Deleted

## 2017-08-23 ENCOUNTER — Telehealth (HOSPITAL_COMMUNITY): Payer: Self-pay | Admitting: *Deleted

## 2017-08-23 NOTE — Telephone Encounter (Signed)
Preadmission screen  

## 2017-08-24 ENCOUNTER — Encounter (HOSPITAL_COMMUNITY): Payer: Self-pay

## 2017-08-24 ENCOUNTER — Inpatient Hospital Stay (HOSPITAL_COMMUNITY): Payer: BLUE CROSS/BLUE SHIELD | Admitting: Anesthesiology

## 2017-08-24 ENCOUNTER — Inpatient Hospital Stay (HOSPITAL_COMMUNITY)
Admission: RE | Admit: 2017-08-24 | Discharge: 2017-08-27 | DRG: 787 | Disposition: A | Discharge status: Home / Self Care | Payer: BLUE CROSS/BLUE SHIELD | Attending: Obstetrics and Gynecology | Admitting: Obstetrics and Gynecology

## 2017-08-24 DIAGNOSIS — O9081 Anemia of the puerperium: Secondary | ICD-10-CM | POA: Diagnosis not present

## 2017-08-24 DIAGNOSIS — Z87891 Personal history of nicotine dependence: Secondary | ICD-10-CM

## 2017-08-24 DIAGNOSIS — D62 Acute posthemorrhagic anemia: Secondary | ICD-10-CM | POA: Diagnosis not present

## 2017-08-24 DIAGNOSIS — Z3A00 Weeks of gestation of pregnancy not specified: Secondary | ICD-10-CM | POA: Diagnosis not present

## 2017-08-24 DIAGNOSIS — A6000 Herpesviral infection of urogenital system, unspecified: Secondary | ICD-10-CM | POA: Diagnosis present

## 2017-08-24 DIAGNOSIS — Z3483 Encounter for supervision of other normal pregnancy, third trimester: Secondary | ICD-10-CM | POA: Diagnosis not present

## 2017-08-24 DIAGNOSIS — O9832 Other infections with a predominantly sexual mode of transmission complicating childbirth: Principal | ICD-10-CM | POA: Diagnosis present

## 2017-08-24 DIAGNOSIS — Z3490 Encounter for supervision of normal pregnancy, unspecified, unspecified trimester: Secondary | ICD-10-CM

## 2017-08-24 LAB — CBC
HEMATOCRIT: 41.4 % (ref 36.0–46.0)
Hemoglobin: 13.9 g/dL (ref 12.0–15.0)
MCH: 30 pg (ref 26.0–34.0)
MCHC: 33.6 g/dL (ref 30.0–36.0)
MCV: 89.4 fL (ref 78.0–100.0)
Platelets: 263 10*3/uL (ref 150–400)
RBC: 4.63 MIL/uL (ref 3.87–5.11)
RDW: 13.6 % (ref 11.5–15.5)
WBC: 13.3 10*3/uL — AB (ref 4.0–10.5)

## 2017-08-24 LAB — TYPE AND SCREEN
ABO/RH(D): O POS
ANTIBODY SCREEN: NEGATIVE

## 2017-08-24 LAB — ABO/RH: ABO/RH(D): O POS

## 2017-08-24 MED ORDER — ACETAMINOPHEN 325 MG PO TABS: 650.0000 mg | ORAL_TABLET | ORAL | Status: DC | PRN

## 2017-08-24 MED ORDER — EPHEDRINE 5 MG/ML INJ: 10.0000 mg | INTRAVENOUS | Status: DC | PRN

## 2017-08-24 MED ORDER — OXYTOCIN 40 UNITS IN LACTATED RINGERS INFUSION - SIMPLE MED: 2.5000 [IU]/h | INTRAVENOUS | Status: DC

## 2017-08-24 MED ORDER — OXYCODONE-ACETAMINOPHEN 5-325 MG PO TABS: 1.0000 | ORAL_TABLET | ORAL | Status: DC | PRN

## 2017-08-24 MED ORDER — DIPHENHYDRAMINE HCL 50 MG/ML IJ SOLN: 12.5000 mg | INTRAMUSCULAR | Status: DC | PRN

## 2017-08-24 MED ORDER — OXYTOCIN 40 UNITS IN LACTATED RINGERS INFUSION - SIMPLE MED
1.0000 m[IU]/min | INTRAVENOUS | Status: DC
  Administered 2017-08-24: 16 m[IU]/min via INTRAVENOUS
  Administered 2017-08-24: 2 m[IU]/min via INTRAVENOUS
  Filled 2017-08-24 (×2): qty 1000

## 2017-08-24 MED ORDER — LACTATED RINGERS IV SOLN: 500.0000 mL | Freq: Once | INTRAVENOUS | Status: DC

## 2017-08-24 MED ORDER — LIDOCAINE HCL (PF) 1 % IJ SOLN
INTRAMUSCULAR | Status: DC | PRN
  Administered 2017-08-24 (×2): 4 mL via EPIDURAL

## 2017-08-24 MED ORDER — PHENYLEPHRINE 40 MCG/ML (10ML) SYRINGE FOR IV PUSH (FOR BLOOD PRESSURE SUPPORT)
PREFILLED_SYRINGE | INTRAVENOUS | Status: AC
  Filled 2017-08-24: qty 10

## 2017-08-24 MED ORDER — LACTATED RINGERS IV SOLN
INTRAVENOUS | Status: DC
  Administered 2017-08-24 – 2017-08-25 (×5): via INTRAVENOUS

## 2017-08-24 MED ORDER — OXYCODONE-ACETAMINOPHEN 5-325 MG PO TABS: 2.0000 | ORAL_TABLET | ORAL | Status: DC | PRN

## 2017-08-24 MED ORDER — LIDOCAINE HCL (PF) 1 % IJ SOLN
30.0000 mL | INTRAMUSCULAR | Status: DC | PRN
  Filled 2017-08-24: qty 30

## 2017-08-24 MED ORDER — SOD CITRATE-CITRIC ACID 500-334 MG/5ML PO SOLN
30.0000 mL | ORAL | Status: DC | PRN
  Administered 2017-08-24 – 2017-08-25 (×2): 30 mL via ORAL
  Filled 2017-08-24 (×2): qty 15

## 2017-08-24 MED ORDER — FLEET ENEMA 7-19 GM/118ML RE ENEM: 1.0000 | ENEMA | RECTAL | Status: DC | PRN

## 2017-08-24 MED ORDER — LACTATED RINGERS IV SOLN: 500.0000 mL | INTRAVENOUS | Status: DC | PRN

## 2017-08-24 MED ORDER — OXYTOCIN BOLUS FROM INFUSION: 500.0000 mL | Freq: Once | INTRAVENOUS | Status: DC

## 2017-08-24 MED ORDER — PHENYLEPHRINE 40 MCG/ML (10ML) SYRINGE FOR IV PUSH (FOR BLOOD PRESSURE SUPPORT): 80.0000 ug | PREFILLED_SYRINGE | INTRAVENOUS | Status: DC | PRN

## 2017-08-24 MED ORDER — ONDANSETRON HCL 4 MG/2ML IJ SOLN
4.0000 mg | Freq: Four times a day (QID) | INTRAMUSCULAR | Status: DC | PRN
  Administered 2017-08-24 – 2017-08-25 (×2): 4 mg via INTRAVENOUS
  Filled 2017-08-24 (×2): qty 2

## 2017-08-24 MED ORDER — FENTANYL 2.5 MCG/ML BUPIVACAINE 1/10 % EPIDURAL INFUSION (WH - ANES)
14.0000 mL/h | INTRAMUSCULAR | Status: DC | PRN
  Administered 2017-08-24 (×2): 14 mL/h via EPIDURAL
  Filled 2017-08-24: qty 100

## 2017-08-24 MED ORDER — TERBUTALINE SULFATE 1 MG/ML IJ SOLN: 0.2500 mg | Freq: Once | INTRAMUSCULAR | Status: DC | PRN

## 2017-08-24 MED ORDER — FENTANYL 2.5 MCG/ML BUPIVACAINE 1/10 % EPIDURAL INFUSION (WH - ANES)
INTRAMUSCULAR | Status: AC
  Filled 2017-08-24: qty 100

## 2017-08-24 NOTE — Anesthesia Preprocedure Evaluation (Signed)
Anesthesia Evaluation  Patient identified by MRN, date of birth, ID band Patient awake    Reviewed: Allergy & Precautions, Patient's Chart, lab work & pertinent test results  History of Anesthesia Complications (+) PONV and history of anesthetic complications  Airway Mallampati: II  TM Distance: >3 FB Neck ROM: Full    Dental no notable dental hx. (+) Teeth Intact   Pulmonary former smoker,    Pulmonary exam normal breath sounds clear to auscultation       Cardiovascular Normal cardiovascular exam Rhythm:Regular Rate:Normal     Neuro/Psych PSYCHIATRIC DISORDERS Anxiety Depression negative neurological ROS     GI/Hepatic   Endo/Other    Renal/GU   negative genitourinary   Musculoskeletal negative musculoskeletal ROS (+)   Abdominal   Peds  Hematology   Anesthesia Other Findings   Reproductive/Obstetrics (+) Pregnancy HSV Abnormal Pap                              Anesthesia Physical Anesthesia Plan  ASA: II  Anesthesia Plan: Epidural   Post-op Pain Management:    Induction:   PONV Risk Score and Plan:   Airway Management Planned: Natural Airway  Additional Equipment:   Intra-op Plan:   Post-operative Plan:   Informed Consent: I have reviewed the patients History and Physical, chart, labs and discussed the procedure including the risks, benefits and alternatives for the proposed anesthesia with the patient or authorized representative who has indicated his/her understanding and acceptance.     Plan Discussed with:   Anesthesia Plan Comments:         Anesthesia Quick Evaluation

## 2017-08-24 NOTE — Anesthesia Procedure Notes (Signed)
Epidural Patient location during procedure: OB Start time: 08/24/2017 12:48 PM End time: 08/24/2017 1:02 PM  Staffing Anesthesiologist: Mal AmabileFoster, Cipriana Biller, MD Performed: anesthesiologist   Preanesthetic Checklist Completed: patient identified, site marked, surgical consent, pre-op evaluation, timeout performed, IV checked, risks and benefits discussed and monitors and equipment checked  Epidural Patient position: sitting Prep: site prepped and draped and DuraPrep Patient monitoring: continuous pulse ox and blood pressure Approach: midline Location: L3-L4 Injection technique: LOR air  Needle:  Needle type: Tuohy  Needle gauge: 17 G Needle length: 9 cm and 9 Needle insertion depth: 5 cm cm Catheter type: closed end flexible Catheter size: 19 Gauge Catheter at skin depth: 10 cm Test dose: negative and Other  Assessment Events: blood not aspirated, injection not painful, no injection resistance, negative IV test and no paresthesia  Additional Notes Patient identified. Risks and benefits discussed including failed block, incomplete  Pain control, post dural puncture headache, nerve damage, paralysis, blood pressure Changes, nausea, vomiting, reactions to medications-both toxic and allergic and post Partum back pain. All questions were answered. Patient expressed understanding and wished to proceed. Sterile technique was used throughout procedure. Epidural site was Dressed with sterile barrier dressing. No paresthesias, signs of intravascular injection Or signs of intrathecal spread were encountered.  Patient was more comfortable after the epidural was dosed. Please see RN's note for documentation of vital signs and FHR which are stable.

## 2017-08-24 NOTE — Anesthesia Pain Management Evaluation Note (Signed)
  CRNA Pain Management Visit Note  Patient: Courtney Wyatt, 32 y.o., female  "Hello I am a member of the anesthesia team at [facility]. We have an anesthesia team available at all times to provide care throughout the hospital, including epidural management and anesthesia for C-section. I don't know your plan for the delivery whether it a natural birth, water birth, IV sedation, nitrous supplementation, doula or epidural, but we want to meet your pain goals."   1.Was your pain managed to your expectations on prior hospitalizations?   No prior hospitalizations  2.What is your expectation for pain management during this hospitalization?     Epidural  3.How can we help you reach that goal? unsure  Record the patient's initial score and the patient's pain goal.   Pain: 0  Pain Goal: 5 [facility] wants you to be able to say your pain was always managed very well.  Cephus ShellingBURGER,Braylea Brancato 08/24/2017

## 2017-08-24 NOTE — Progress Notes (Signed)
Pit on 7616mu/min, IUPC flushed, now 8-9/()/vtx/0 to -1, FHR cat I

## 2017-08-24 NOTE — Progress Notes (Signed)
comft w/ epid, FHR cat I, 3-4, still leaking clear AF>>>IUPC, will aug w/ pit

## 2017-08-24 NOTE — H&P (Signed)
Naveah G Driskill is a 32 y.o. female presenting for AROM/IOL. OB History    Gravida  2   Para  0 Clydia Llano  Term  0   Preterm  0   AB  1   Living  0     SAB  1   TAB  0   Ectopic  0   Multiple  0   Live Births             Past Medical History:  Diagnosis Date  . ADD (attention deficit disorder)   . Anxiety   . Bacterial vaginosis   . 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 History of Papanicolaou smear of cervix 07/10/2013; 10/21/2014   NEG; NEG  . 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 Interstitial cystitis   . PONV (postoperative nausea and vomiting)   . SAB (spontaneous abortion) 07/14/2016   Past Surgical History:  Procedure Laterality Date  . LAPAROSCOPY  04/2004  . TONSILLECTOMY  1994   DR.  Colonel BaldJEUNGLE  . WISDOM TOOTH EXTRACTION     Family History: family history includes Breast cancer (age of onset: 3765) in her maternal aunt; Depression in her mother; Diabetes in her father; Hyperlipidemia in her mother; Hypertension in her father and mother; Lung cancer in her maternal grandmother. Social History:  reports that she quit smoking about 22 months ago. She has never used smokeless tobacco. She reports that she drinks alcohol. She reports that she does not use drugs.     Maternal Diabetes: No Genetic Screening: Normal Maternal Ultrasounds/Referrals: Normal Fetal Ultrasounds or other Referrals:  None Maternal Substance Abuse:  No Significant Maternal Medications:  None Significant Maternal Lab Results:  None Other Comments:  None  ROS History Dilation: 2.5 Effacement (%): 50 Station: -2 Exam by:: Ardell Makarewicz Blood pressure 120/67, pulse 79, last menstrual period 11/16/2016. Exam Physical Exam  Constitutional: She is oriented to person, place, and time. She appears well-developed and well-nourished.  HENT:  Head: Normocephalic and atraumatic.  Neck: Normal range of motion. Neck supple.  Cardiovascular: Normal rate and regular rhythm.  Respiratory: Effort normal  and breath sounds normal.  GI:  Term FH, FHR 142  Genitourinary:  Genitourinary Comments: 2-3/50/vtx/ARMM>>>clr  Musculoskeletal: Normal range of motion.  Neurological: She is oriented to person, place, and time.    Prenatal labs: ABO, Rh: --/--/O POS, O POS Performed at [facility], [address] [place], [place] [address]  [phone](08/21 1022) Antibody: NEG (08/21 1022) Rubella: Immune (01/18 0000) RPR: Nonreactive (01/18 0000)  HBsAg: Negative (01/18 0000)  HIV: Non-reactive (01/18 0000)  GBS: Negative (08/15 0000)   Assessment/Plan: Term IUP, fav cx , for AROM IOL   Meriel PicaRichard M Magdalyn Arenivas 08/24/2017, 2:01 PM

## 2017-08-25 ENCOUNTER — Encounter (HOSPITAL_COMMUNITY)
Admission: RE | Disposition: A | Discharge status: Home / Self Care | Payer: Self-pay | Source: Home / Self Care | Attending: Obstetrics and Gynecology

## 2017-08-25 ENCOUNTER — Encounter (HOSPITAL_COMMUNITY): Payer: Self-pay

## 2017-08-25 LAB — CBC
HCT: 27.4 % — ABNORMAL LOW (ref 36.0–46.0)
HEMOGLOBIN: 9.4 g/dL — AB (ref 12.0–15.0)
MCH: 30.5 pg (ref 26.0–34.0)
MCHC: 34.3 g/dL (ref 30.0–36.0)
MCV: 89 fL (ref 78.0–100.0)
Platelets: 239 10*3/uL (ref 150–400)
RBC: 3.08 MIL/uL — ABNORMAL LOW (ref 3.87–5.11)
RDW: 13.7 % (ref 11.5–15.5)
WBC: 23.2 10*3/uL — ABNORMAL HIGH (ref 4.0–10.5)

## 2017-08-25 LAB — RPR: RPR: NONREACTIVE

## 2017-08-25 SURGERY — Surgical Case
Anesthesia: Epidural

## 2017-08-25 MED ORDER — SODIUM CHLORIDE 0.9% FLUSH: 3.0000 mL | INTRAVENOUS | Status: DC | PRN

## 2017-08-25 MED ORDER — ZOLPIDEM TARTRATE 5 MG PO TABS: 5.0000 mg | ORAL_TABLET | Freq: Every evening | ORAL | Status: DC | PRN

## 2017-08-25 MED ORDER — METHYLERGONOVINE MALEATE 0.2 MG PO TABS
0.2000 mg | ORAL_TABLET | ORAL | Status: AC
  Administered 2017-08-25 – 2017-08-26 (×6): 0.2 mg via ORAL
  Filled 2017-08-25 (×6): qty 1

## 2017-08-25 MED ORDER — SODIUM CHLORIDE 0.9 % IV SOLN: 250.0000 mL | INTRAVENOUS | Status: DC

## 2017-08-25 MED ORDER — ONDANSETRON HCL 4 MG/2ML IJ SOLN
INTRAMUSCULAR | Status: AC
  Filled 2017-08-25: qty 4

## 2017-08-25 MED ORDER — MORPHINE SULFATE (PF) 0.5 MG/ML IJ SOLN
INTRAMUSCULAR | Status: DC | PRN
  Administered 2017-08-25: 4 mg via EPIDURAL

## 2017-08-25 MED ORDER — OXYTOCIN 10 UNIT/ML IJ SOLN
INTRAVENOUS | Status: DC | PRN
  Administered 2017-08-25: 40 [IU] via INTRAVENOUS

## 2017-08-25 MED ORDER — MENTHOL 3 MG MT LOZG: 1.0000 | LOZENGE | OROMUCOSAL | Status: DC | PRN

## 2017-08-25 MED ORDER — DEXTROSE IN LACTATED RINGERS 5 % IV SOLN
INTRAVENOUS | Status: DC
  Administered 2017-08-25: 11:00:00 via INTRAVENOUS

## 2017-08-25 MED ORDER — SCOPOLAMINE 1 MG/3DAYS TD PT72
1.0000 | MEDICATED_PATCH | Freq: Once | TRANSDERMAL | Status: DC
  Filled 2017-08-25: qty 1

## 2017-08-25 MED ORDER — METOCLOPRAMIDE HCL 5 MG/ML IJ SOLN
INTRAMUSCULAR | Status: AC
  Filled 2017-08-25: qty 2

## 2017-08-25 MED ORDER — HYDROMORPHONE HCL 1 MG/ML IJ SOLN
0.2500 mg | INTRAMUSCULAR | Status: DC | PRN
  Administered 2017-08-25 (×2): 0.5 mg via INTRAVENOUS

## 2017-08-25 MED ORDER — SCOPOLAMINE 1 MG/3DAYS TD PT72
MEDICATED_PATCH | TRANSDERMAL | Status: DC | PRN
  Administered 2017-08-25: 1 via TRANSDERMAL

## 2017-08-25 MED ORDER — NALBUPHINE HCL 10 MG/ML IJ SOLN: 5.0000 mg | Freq: Once | INTRAMUSCULAR | Status: DC | PRN

## 2017-08-25 MED ORDER — DIPHENHYDRAMINE HCL 25 MG PO CAPS
25.0000 mg | ORAL_CAPSULE | ORAL | Status: DC | PRN
  Administered 2017-08-25: 25 mg via ORAL

## 2017-08-25 MED ORDER — KETOROLAC TROMETHAMINE 30 MG/ML IJ SOLN: 30.0000 mg | Freq: Four times a day (QID) | INTRAMUSCULAR | Status: AC | PRN

## 2017-08-25 MED ORDER — STERILE WATER FOR IRRIGATION IR SOLN
Status: DC | PRN
  Administered 2017-08-25: 1000 mL

## 2017-08-25 MED ORDER — MORPHINE SULFATE (PF) 0.5 MG/ML IJ SOLN
INTRAMUSCULAR | Status: AC
  Filled 2017-08-25: qty 10

## 2017-08-25 MED ORDER — NALBUPHINE HCL 10 MG/ML IJ SOLN: 5.0000 mg | INTRAMUSCULAR | Status: DC | PRN

## 2017-08-25 MED ORDER — SENNOSIDES-DOCUSATE SODIUM 8.6-50 MG PO TABS
2.0000 | ORAL_TABLET | ORAL | Status: DC
  Administered 2017-08-26 (×2): 2 via ORAL
  Filled 2017-08-25 (×2): qty 2

## 2017-08-25 MED ORDER — METHYLERGONOVINE MALEATE 0.2 MG/ML IJ SOLN
INTRAMUSCULAR | Status: AC
  Filled 2017-08-25: qty 1

## 2017-08-25 MED ORDER — CEFAZOLIN SODIUM-DEXTROSE 2-3 GM-%(50ML) IV SOLR
INTRAVENOUS | Status: DC | PRN
  Administered 2017-08-25: 2 g via INTRAVENOUS

## 2017-08-25 MED ORDER — OXYTOCIN 40 UNITS IN LACTATED RINGERS INFUSION - SIMPLE MED: 2.5000 [IU]/h | INTRAVENOUS | Status: AC

## 2017-08-25 MED ORDER — IBUPROFEN 800 MG PO TABS
800.0000 mg | ORAL_TABLET | Freq: Three times a day (TID) | ORAL | Status: DC | PRN
  Administered 2017-08-25 – 2017-08-27 (×7): 800 mg via ORAL
  Filled 2017-08-25 (×7): qty 1

## 2017-08-25 MED ORDER — OXYCODONE-ACETAMINOPHEN 5-325 MG PO TABS
2.0000 | ORAL_TABLET | ORAL | Status: DC | PRN
  Administered 2017-08-25 – 2017-08-26 (×2): 2 via ORAL
  Filled 2017-08-25 (×2): qty 2

## 2017-08-25 MED ORDER — SIMETHICONE 80 MG PO CHEW
80.0000 mg | CHEWABLE_TABLET | ORAL | Status: DC
  Administered 2017-08-26 (×2): 80 mg via ORAL
  Filled 2017-08-25 (×2): qty 1

## 2017-08-25 MED ORDER — BISACODYL 10 MG RE SUPP: 10.0000 mg | Freq: Every day | RECTAL | Status: DC | PRN

## 2017-08-25 MED ORDER — COCONUT OIL OIL
1.0000 "application " | TOPICAL_OIL | Status: DC | PRN
  Administered 2017-08-26: 1 via TOPICAL
  Filled 2017-08-25: qty 120

## 2017-08-25 MED ORDER — NALOXONE HCL 0.4 MG/ML IJ SOLN: 0.4000 mg | INTRAMUSCULAR | Status: DC | PRN

## 2017-08-25 MED ORDER — DIPHENHYDRAMINE HCL 25 MG PO CAPS
25.0000 mg | ORAL_CAPSULE | Freq: Four times a day (QID) | ORAL | Status: DC | PRN
  Filled 2017-08-25: qty 1

## 2017-08-25 MED ORDER — FLEET ENEMA 7-19 GM/118ML RE ENEM: 1.0000 | ENEMA | Freq: Every day | RECTAL | Status: DC | PRN

## 2017-08-25 MED ORDER — HYDROMORPHONE HCL 1 MG/ML IJ SOLN
INTRAMUSCULAR | Status: AC
  Filled 2017-08-25: qty 1

## 2017-08-25 MED ORDER — SIMETHICONE 80 MG PO CHEW
80.0000 mg | CHEWABLE_TABLET | ORAL | Status: DC | PRN
  Administered 2017-08-27: 80 mg via ORAL

## 2017-08-25 MED ORDER — DEXAMETHASONE SODIUM PHOSPHATE 4 MG/ML IJ SOLN
INTRAMUSCULAR | Status: DC | PRN
  Administered 2017-08-25: 4 mg via INTRAVENOUS

## 2017-08-25 MED ORDER — SODIUM CHLORIDE 0.9 % IR SOLN
Status: DC | PRN
  Administered 2017-08-25: 1000 mL

## 2017-08-25 MED ORDER — METOCLOPRAMIDE HCL 5 MG/ML IJ SOLN
INTRAMUSCULAR | Status: DC | PRN
  Administered 2017-08-25: 10 mg via INTRAVENOUS

## 2017-08-25 MED ORDER — PRENATAL MULTIVITAMIN CH
1.0000 | ORAL_TABLET | Freq: Every day | ORAL | Status: DC
  Administered 2017-08-25 – 2017-08-27 (×3): 1 via ORAL
  Filled 2017-08-25 (×3): qty 1

## 2017-08-25 MED ORDER — ONDANSETRON HCL 4 MG/2ML IJ SOLN
INTRAMUSCULAR | Status: DC | PRN
  Administered 2017-08-25 (×2): 4 mg via INTRAVENOUS

## 2017-08-25 MED ORDER — OXYCODONE-ACETAMINOPHEN 5-325 MG PO TABS
1.0000 | ORAL_TABLET | ORAL | Status: DC | PRN
  Administered 2017-08-25 – 2017-08-27 (×7): 1 via ORAL
  Filled 2017-08-25 (×8): qty 1

## 2017-08-25 MED ORDER — KETOROLAC TROMETHAMINE 30 MG/ML IJ SOLN: 30.0000 mg | Freq: Once | INTRAMUSCULAR | Status: DC | PRN

## 2017-08-25 MED ORDER — ONDANSETRON HCL 4 MG/2ML IJ SOLN: 4.0000 mg | Freq: Three times a day (TID) | INTRAMUSCULAR | Status: DC | PRN

## 2017-08-25 MED ORDER — SCOPOLAMINE 1 MG/3DAYS TD PT72
MEDICATED_PATCH | TRANSDERMAL | Status: AC
  Filled 2017-08-25: qty 1

## 2017-08-25 MED ORDER — ACETAMINOPHEN 500 MG PO TABS
1000.0000 mg | ORAL_TABLET | Freq: Four times a day (QID) | ORAL | Status: AC
  Filled 2017-08-25: qty 2

## 2017-08-25 MED ORDER — PHENYLEPHRINE HCL 10 MG/ML IJ SOLN
INTRAMUSCULAR | Status: DC | PRN
  Administered 2017-08-25 (×3): 40 ug via INTRAVENOUS

## 2017-08-25 MED ORDER — SODIUM BICARBONATE 8.4 % IV SOLN
INTRAVENOUS | Status: DC | PRN
  Administered 2017-08-25 (×2): 5 mL via EPIDURAL

## 2017-08-25 MED ORDER — DIBUCAINE 1 % RE OINT: 1.0000 "application " | TOPICAL_OINTMENT | RECTAL | Status: DC | PRN

## 2017-08-25 MED ORDER — NALOXONE HCL 4 MG/10ML IJ SOLN
1.0000 ug/kg/h | INTRAVENOUS | Status: DC | PRN
  Filled 2017-08-25: qty 5

## 2017-08-25 MED ORDER — PROMETHAZINE HCL 25 MG/ML IJ SOLN: 6.2500 mg | INTRAMUSCULAR | Status: DC | PRN

## 2017-08-25 MED ORDER — MEPERIDINE HCL 25 MG/ML IJ SOLN
INTRAMUSCULAR | Status: DC | PRN
  Administered 2017-08-25: 12.5 mg via INTRAVENOUS

## 2017-08-25 MED ORDER — OXYTOCIN 10 UNIT/ML IJ SOLN
INTRAMUSCULAR | Status: AC
  Filled 2017-08-25: qty 4

## 2017-08-25 MED ORDER — ACETAMINOPHEN 325 MG PO TABS
650.0000 mg | ORAL_TABLET | ORAL | Status: DC | PRN
  Administered 2017-08-25: 650 mg via ORAL
  Filled 2017-08-25: qty 2

## 2017-08-25 MED ORDER — MEPERIDINE HCL 25 MG/ML IJ SOLN
INTRAMUSCULAR | Status: AC
  Filled 2017-08-25: qty 1

## 2017-08-25 MED ORDER — SIMETHICONE 80 MG PO CHEW
80.0000 mg | CHEWABLE_TABLET | Freq: Three times a day (TID) | ORAL | Status: DC
  Administered 2017-08-25 – 2017-08-26 (×6): 80 mg via ORAL
  Filled 2017-08-25 (×5): qty 1

## 2017-08-25 MED ORDER — LACTATED RINGERS IV SOLN
INTRAVENOUS | Status: DC | PRN
  Administered 2017-08-25: 03:00:00 via INTRAVENOUS

## 2017-08-25 MED ORDER — KETOROLAC TROMETHAMINE 30 MG/ML IJ SOLN
INTRAMUSCULAR | Status: AC
  Filled 2017-08-25: qty 1

## 2017-08-25 MED ORDER — MEPERIDINE HCL 25 MG/ML IJ SOLN: 6.2500 mg | INTRAMUSCULAR | Status: DC | PRN

## 2017-08-25 MED ORDER — TETANUS-DIPHTH-ACELL PERTUSSIS 5-2.5-18.5 LF-MCG/0.5 IM SUSP: 0.5000 mL | Freq: Once | INTRAMUSCULAR | Status: DC

## 2017-08-25 MED ORDER — DIPHENHYDRAMINE HCL 50 MG/ML IJ SOLN: 12.5000 mg | INTRAMUSCULAR | Status: DC | PRN

## 2017-08-25 MED ORDER — METHYLERGONOVINE MALEATE 0.2 MG/ML IJ SOLN
0.2000 mg | Freq: Once | INTRAMUSCULAR | Status: AC
  Administered 2017-08-25: 0.2 mg via INTRAMUSCULAR

## 2017-08-25 MED ORDER — MEASLES, MUMPS & RUBELLA VAC ~~LOC~~ INJ
0.5000 mL | INJECTION | Freq: Once | SUBCUTANEOUS | Status: DC
  Filled 2017-08-25: qty 0.5

## 2017-08-25 MED ORDER — WITCH HAZEL-GLYCERIN EX PADS: 1.0000 "application " | MEDICATED_PAD | CUTANEOUS | Status: DC | PRN

## 2017-08-25 MED ORDER — PHENYLEPHRINE 8 MG IN D5W 100 ML (0.08MG/ML) PREMIX OPTIME: INJECTION | INTRAVENOUS | Status: DC | PRN

## 2017-08-25 MED ORDER — SODIUM CHLORIDE 0.9% FLUSH
3.0000 mL | Freq: Two times a day (BID) | INTRAVENOUS | Status: DC
  Administered 2017-08-25: 3 mL via INTRAVENOUS

## 2017-08-25 SURGICAL SUPPLY — 26 items
CHLORAPREP W/TINT 26ML (MISCELLANEOUS) ×2 IMPLANT
CLAMP CORD UMBIL (MISCELLANEOUS) IMPLANT
CLOTH BEACON ORANGE TIMEOUT ST (SAFETY) ×2 IMPLANT
CLSR STERI-STRIP ANTIMIC 1/2X4 (GAUZE/BANDAGES/DRESSINGS) ×2 IMPLANT
DRSG OPSITE POSTOP 4X10 (GAUZE/BANDAGES/DRESSINGS) ×2 IMPLANT
ELECT REM PT RETURN 9FT ADLT (ELECTROSURGICAL) ×2
ELECTRODE REM PT RTRN 9FT ADLT (ELECTROSURGICAL) ×1 IMPLANT
EXTRACTOR VACUUM M CUP 4 TUBE (SUCTIONS) IMPLANT
GLOVE BIO SURGEON STRL SZ7 (GLOVE) ×2 IMPLANT
GLOVE BIOGEL PI IND STRL 7.0 (GLOVE) ×2 IMPLANT
GLOVE BIOGEL PI INDICATOR 7.0 (GLOVE) ×2
GOWN STRL REUS W/TWL LRG LVL3 (GOWN DISPOSABLE) ×4 IMPLANT
KIT ABG SYR 3ML LUER SLIP (SYRINGE) IMPLANT
NEEDLE HYPO 25X5/8 SAFETYGLIDE (NEEDLE) ×2 IMPLANT
NS IRRIG 1000ML POUR BTL (IV SOLUTION) ×2 IMPLANT
PACK C SECTION WH (CUSTOM PROCEDURE TRAY) ×2 IMPLANT
PAD OB MATERNITY 4.3X12.25 (PERSONAL CARE ITEMS) ×2 IMPLANT
PENCIL SMOKE EVAC W/HOLSTER (ELECTROSURGICAL) ×2 IMPLANT
STRIP CLOSURE SKIN 1/2X4 (GAUZE/BANDAGES/DRESSINGS) ×2 IMPLANT
SUT CHROMIC 0 CTX 36 (SUTURE) ×6 IMPLANT
SUT MON AB 4-0 PS1 27 (SUTURE) ×2 IMPLANT
SUT PDS AB 0 CT1 27 (SUTURE) ×4 IMPLANT
SUT VIC AB 3-0 CT1 27 (SUTURE) ×2
SUT VIC AB 3-0 CT1 TAPERPNT 27 (SUTURE) ×2 IMPLANT
TOWEL OR 17X24 6PK STRL BLUE (TOWEL DISPOSABLE) ×2 IMPLANT
TRAY FOLEY W/BAG SLVR 14FR LF (SET/KITS/TRAYS/PACK) ×2 IMPLANT

## 2017-08-25 NOTE — Transfer of Care (Signed)
Immediate Anesthesia Transfer of Care Note  Patient: Courtney Wyatt  Procedure(s) Performed: CESAREAN SECTION (N/A )  Patient Location: PACU  Anesthesia Type:Epidural  Level of Consciousness: awake, alert  and oriented  Airway & Oxygen Therapy: Patient Spontanous Breathing  Post-op Assessment: Report given to RN and Post -op Vital signs reviewed and stable  Post vital signs: Reviewed and stable   HR 93 Sats 97% Resp 21 BP 118/65  Last Vitals:  Vitals Value Taken Time  BP 118/65 08/25/2017  3:35 AM  Temp    Pulse 98 08/25/2017  3:44 AM  Resp 18 08/25/2017  3:44 AM  SpO2 100 % 08/25/2017  3:44 AM  Vitals shown include unvalidated device data.  Last Pain:  Vitals:   08/24/17 2101  PainSc: 0-No pain         Complications: No apparent anesthesia complications

## 2017-08-25 NOTE — Progress Notes (Signed)
During shift change, night shift RN reported increased EBL after delivery in OR and PACU and noted that with both fundal checks after transferring to mother baby, fundus would be firm; however, patient would trickle. During report, night shift and day shift RN massaged fundus and checked patient's bleeding in which she continued to trickle with massage. Pitocin still running at 62.5 mL/hr. Called Dr. Rana SnareLowe to notify of patient's status and for further orders. Dr. Rana SnareLowe ordered methergine series by mouth for now and to continue to monitor. Earl Galasborne, Linda HedgesStefanie [place] Wyoming[place]udspeth

## 2017-08-25 NOTE — Anesthesia Postprocedure Evaluation (Signed)
Anesthesia Post Note  Patient: Courtney Wyatt  Procedure(s) Performed: CESAREAN SECTION (N/A )     Patient location during evaluation: Mother Baby Anesthesia Type: Epidural Level of consciousness: awake and alert Pain management: pain level controlled Vital Signs Assessment: post-procedure vital signs reviewed and stable Respiratory status: spontaneous breathing, nonlabored ventilation and respiratory function stable Cardiovascular status: stable Postop Assessment: no headache, no backache and epidural receding Anesthetic complications: no    Last Vitals:  Vitals:   08/25/17 0550 08/25/17 0654  BP: (!) 101/56 110/69  Pulse: 92 92  Resp: 16 15  Temp: (!) 36.4 C   SpO2: 99% 98%    Last Pain:  Vitals:   08/25/17 0700  TempSrc:   PainSc: 1    Pain Goal:                 EchoStarMERRITT,Daleigh Pollinger

## 2017-08-25 NOTE — Lactation Note (Signed)
This note was copied from a baby's chart. Lactation Consultation Note  Patient Name: Courtney Karoline CaldwellDeana Leber ZOXWR'UToday's Date: 08/25/2017 Reason for consult: Initial assessment;Primapara;1st time breastfeeding;Term  3718 hours old FT female who is being exclusively BF by his mother, she's a P1. LC reviewed hand expression with mom and she was able to get a few small droplets of colostrum our of her right breast prior latching baby. Reviewed treatment for sore nipples. Mom has a Spectra DEBP at home. Parents were receptive to teaching and also very cooperative.  Noticed that mom has short shafted nipples; set her up with breast shells, she did bring a nursing bra to the hospital, breast shells instructions, cleaning and storage were reviewed.  Baby was asleep when entering the room, offered assistance with latch and parents agreed to wake baby up to feed, since it's been more than 3 hours that he hasn't cued.  LC took baby STS in football position to mother's breast, after rearranging the pillows and work on positioning/alignement, he was able to latch very easy tot he right breast, audible swallows were noted during the 16 minutes feeding.   Encouraged mom to feed baby STS 8-12 times in 24 hours or sooner if feeding cues are present. Cluster feeding and baby sleeping cycle were discussed. Reviewed BF brochure, BF resources and feeding diary, both parents are aware of LC services and will call PRN.  Maternal Data Formula Feeding for Exclusion: No Has patient been taught Hand Expression?: Yes Does the patient have breastfeeding experience prior to this delivery?: No  Feeding Feeding Type: Breast Fed Length of feed: 16 min  LATCH Score Latch: Repeated attempts needed to sustain latch, nipple held in mouth throughout feeding, stimulation needed to elicit sucking reflex.(had to correct positioning)  Audible Swallowing: Spontaneous and intermittent  Type of Nipple: Everted at rest and after  stimulation  Comfort (Breast/Nipple): Soft / non-tender  Hold (Positioning): Assistance needed to correctly position infant at breast and maintain latch.  LATCH Score: 8  Interventions Interventions: Breast feeding basics reviewed;Assisted with latch;Skin to skin;Breast massage;Hand express;Breast compression;Adjust position;Support pillows;Shells  Lactation Tools Discussed/Used Tools: Shells Shell Type: Inverted WIC Program: No   Consult Status Consult Status: Follow-up Date: 08/26/17 Follow-up type: In-patient    Roderica Cathell Venetia ConstableS Genesys Coggeshall 08/25/2017, 9:11 PM

## 2017-08-25 NOTE — Anesthesia Postprocedure Evaluation (Signed)
Anesthesia Post Note  Patient: Courtney Wyatt  Procedure(s) Performed: CESAREAN SECTION (N/A )     Patient location during evaluation: PACU Anesthesia Type: Epidural Level of consciousness: awake Pain management: pain level controlled Vital Signs Assessment: post-procedure vital signs reviewed and stable Respiratory status: spontaneous breathing Cardiovascular status: stable Postop Assessment: no apparent nausea or vomiting, no headache, no backache, epidural receding and patient able to bend at knees Anesthetic complications: no    Last Vitals:  Vitals:   08/25/17 0400 08/25/17 0415  BP: 107/62 98/65  Pulse: 99 (!) 102  Resp: 18 16  Temp: (!) 36.3 C   SpO2: 96%     Last Pain:  Vitals:   08/25/17 0415  TempSrc:   PainSc: 0-No pain   Pain Goal:                 Shanel Prazak JR,JOHN Braeley Buskey

## 2017-08-25 NOTE — Addendum Note (Signed)
Addendum  created 08/25/17 0813 by Orlie PollenMerritt, Rudean Icenhour R, CRNA   Sign clinical note

## 2017-08-25 NOTE — Progress Notes (Signed)
Subjective: Postpartum Day 0: Cesarean Delivery Patient reports tolerating PO.    Objective: Vital signs in last 24 hours: Temp:  [97.4 F (36.3 C)-98.4 F (36.9 C)] 98.4 F (36.9 C) (08/22 0810) Pulse Rate:  [72-111] 72 (08/22 0810) Resp:  [12-21] 16 (08/22 0810) BP: (88-147)/(56-87) 105/63 (08/22 0810) SpO2:  [96 %-100 %] 98 % (08/22 0810) Weight:  [73.2 kg] 73.2 kg (08/21 1817)  Physical Exam:  General: alert, cooperative, appears stated age and no distress Lochia: appropriate Uterine Fundus: firm Incision: healing well DVT Evaluation: No evidence of DVT seen on physical exam.  Recent Labs    08/24/17 1023 08/25/17 0633  HGB 13.9 9.4*  HCT 41.4 27.4*    Assessment/Plan: Status post Cesarean section. Doing well postoperatively. Post op atony responded to Pit massage and now on methergine protocol  Continue current care Clinically stable and minimal bleeding.  Elven Laboy C 08/25/2017, 9:40 AM

## 2017-08-25 NOTE — Op Note (Signed)
Preoperative diagnosis: Failure to progress  Postoperative diagnosis: Same  Procedure: Primary low transverse cesarean section  Surgeon: Marcelle OverlieHolland  Anesthesia: Epidural  EBL: 700 cc  Procedure findings:  Patient taken to the operating room after an adequate level of epidural anesthesia was obtained with the patient's legs supine the abdomen prepped and draped in the usual fashion Foley catheter was already positioned.  Appropriate timeouts taken at that point.  She received Ancef 2 g IV preop.  Transverse incision made 2 fingerbreadths above the symphysis carried down to the fascia which was incised and extended transversely, rectus muscles divided in the midline, peritoneum entered superiorly without incident and extended in a vertical fashion.  The vesicouterine serosa was then incised, bluntly and sharply dissected below, bladder blade repositioned.  Transverse incision made in the lower segment extended with blunt dissection clear fluid noted the patient then delivered of a healthy female Apgars 9 and 9 the infant was suctioned cord clamped and passed to the pediatric team for further care.  Placenta was then delivered manually intact, uterus exteriorized, cavity wiped clean with a laparotomy pack, closure obtained with the first layer of 0 chromic in a locked fashion followed by an imbricating layer of 0 chromic.  This was hemostatic bilateral tubes and ovaries were normal.  Prior to closure sponge, needle, instrument counts reported as correct x2.  Peritoneum closed with a 3-0 Vicryl running suture the same on the rectus muscles in the midline 0 PDS used to close the fascia transversely subcutaneous tissue was irrigated noted to be hemostatic was fairly thin was not closed separately 4-0 Monocryl subcuticular skin closure with a honeycomb dressing.  Clear urine noted at the end the case she tolerated this well went to recovery room in good condition.  Dictated with Dragon Medical 1  Meriel Picaichard M  Ashleyanne Hemmingway MD

## 2017-08-26 LAB — CBC
HCT: 21.4 % — ABNORMAL LOW (ref 36.0–46.0)
Hemoglobin: 7.1 g/dL — ABNORMAL LOW (ref 12.0–15.0)
MCH: 30.1 pg (ref 26.0–34.0)
MCHC: 33.2 g/dL (ref 30.0–36.0)
MCV: 90.7 fL (ref 78.0–100.0)
PLATELETS: 205 10*3/uL (ref 150–400)
RBC: 2.36 MIL/uL — AB (ref 3.87–5.11)
RDW: 14.1 % (ref 11.5–15.5)
WBC: 15.9 10*3/uL — AB (ref 4.0–10.5)

## 2017-08-26 LAB — BIRTH TISSUE RECOVERY COLLECTION (PLACENTA DONATION)

## 2017-08-26 NOTE — Progress Notes (Signed)
Dr Rana SnareLowe notified of patient's low BP 77/49 P 72. Pt was in deep sleep prior to BP check and denies dizziness. Instructed to call when OOB for assistance. No new orders received at this time

## 2017-08-26 NOTE — Progress Notes (Signed)
Subjective: Postpartum Day 1: Cesarean Delivery Patient reports tolerating PO.  No dizziness on standing, CP/SOB. Desires circ.    Objective: Vital signs in last 24 hours: Temp:  [97.9 F (36.6 C)-98.3 F (36.8 C)] 97.9 F (36.6 C) (08/23 0507) Pulse Rate:  [72-88] 87 (08/23 0507) Resp:  [16-18] 18 (08/23 0507) BP: (77-106)/(46-62) 91/46 (08/23 0507) SpO2:  [96 %-99 %] 97 % (08/23 0025)  Physical Exam:  General: alert, cooperative and appears stated age Lochia: appropriate Uterine Fundus: firm Incision: healing well, no significant drainage, no dehiscence DVT Evaluation: No evidence of DVT seen on physical exam. Negative Homan's sign. No cords or calf tenderness.  Recent Labs    08/24/17 1023 08/25/17 0633  HGB 13.9 9.4*  HCT 41.4 27.4*    Assessment/Plan: Status post Cesarean section. Doing well postoperatively.  Continue current care. PPH with ABL anemia-CBC ordered this am and pending. VSS. Circ-patient is counseled re: risk of bleeding, infection, and scarring.  All questions were answered and the patient wishes to proceed.  Nissa Stannard 08/26/2017, 9:00 AM

## 2017-08-26 NOTE — Lactation Note (Signed)
This note was copied from a baby's chart. Lactation Consultation Note  Patient Name: Courtney Karoline CaldwellDeana Whisenant ZOXWR'UToday's Date: 08/26/2017 Reason for consult: Follow-up assessment   P1, Baby 35 hours old.  Mother's nipples have small blisters on tips. She is wearing shells with coconut oil. Baby sleeping. Suggest mother call for assistance w/ next feeding. Provided mother w/ comfort gels.  For soreness suggest mother apply ebm or coconut oil while wearing shells and alternate with comfort gels. LC will check latch and oral anatomy at next visit.     Maternal Data    Feeding Feeding Type: Breast Fed Length of feed: 11 min  LATCH Score                   Interventions Interventions: Coconut oil;Shells;Comfort gels  Lactation Tools Discussed/Used     Consult Status Consult Status: Follow-up Date: 08/26/17 Follow-up type: In-patient    [name], Kymberli Wiegand [facility] 08/26/2017, 1:59 PM

## 2017-08-26 NOTE — Progress Notes (Signed)
CSW acknowledges consult.  CSW attempted to meet with MOB, however MOB had several room guest.  CSW will attempt to visit with MOB at a later time.   Ryheem Jay Boyd-Gilyard, MSW, LCSW Clinical Social Work [phone]  

## 2017-08-26 NOTE — Lactation Note (Signed)
This note was copied from a baby's chart. Lactation Consultation Note  Patient Name: Courtney Wyatt ZOXWR'UToday's Date: 08/26/2017    Baby 39 hours old and family called to because they have heard clicking noise occasionally when baby is latched. Baby sleeping after 10 min feeding and circ. Marland McalpineStephanie O. RN in room who states she observed latch and baby had bottom lip tucked. She helped flange lip and clicking stopped. Discussed flanged lips and depth.   Mother is alternating with ebm or coconut oil while wearing shells and alternate with comfort gels. Discussed cluster feeding.  Mother has had visitors in room today.  Suggest resting due to possible fussiness and cluster feeding tonight.  Mom encouraged to feed baby 8-12 times/24 hours and with feeding cues.      Maternal Data    Feeding Feeding Type: Breast Fed Length of feed: 10 min  LATCH Score Latch: Grasps breast easily, tongue down, lips flanged, rhythmical sucking.  Audible Swallowing: Spontaneous and intermittent  Type of Nipple: Everted at rest and after stimulation  Comfort (Breast/Nipple): Filling, red/small blisters or bruises, mild/mod discomfort  Hold (Positioning): No assistance needed to correctly position infant at breast.  LATCH Score: 9  Interventions    Lactation Tools Discussed/Used     Consult Status      [name], [name] 08/26/2017, 5:53 PM

## 2017-08-27 MED ORDER — FERROUS SULFATE 325 (65 FE) MG PO TABS
325.0000 mg | ORAL_TABLET | Freq: Two times a day (BID) | ORAL | Status: DC
  Administered 2017-08-27: 325 mg via ORAL
  Filled 2017-08-27: qty 1

## 2017-08-27 MED ORDER — OXYCODONE-ACETAMINOPHEN 5-325 MG PO TABS: 1.0000 | ORAL_TABLET | ORAL | 0 refills | Status: DC | PRN

## 2017-08-27 MED ORDER — FERROUS SULFATE 325 (65 FE) MG PO TABS: 325.0000 mg | ORAL_TABLET | Freq: Two times a day (BID) | ORAL | 0 refills | Status: DC

## 2017-08-27 MED ORDER — IBUPROFEN 800 MG PO TABS: 800.0000 mg | ORAL_TABLET | Freq: Three times a day (TID) | ORAL | 0 refills | Status: DC | PRN

## 2017-08-27 NOTE — Discharge Summary (Signed)
Obstetric Discharge Summary Reason for Admission: induction of labor Prenatal Procedures: none Intrapartum Procedures: cesarean: low cervical, transverse Postpartum Procedures: none Complications-Operative and Postpartum: hemorrhage Hemoglobin  Date Value Ref Range Status  08/26/2017 7.1 (L) 12.0 - 15.0 g/dL Final    Comment:    DELTA CHECK NOTED REPEATED TO VERIFY    HCT  Date Value Ref Range Status  08/26/2017 21.4 (L) 36.0 - 46.0 % Final    Physical Exam:  General: alert, cooperative and appears stated age 8Lochia: appropriate Uterine Fundus: firm Incision: healing well, no significant drainage, no dehiscence DVT Evaluation: No evidence of DVT seen on physical exam. Negative Homan's sign. No cords or calf tenderness.  Discharge Diagnoses: Term Pregnancy-delivered  Discharge Information: Date: 08/27/2017 Activity: pelvic rest Diet: routine Medications: PNV, Ibuprofen, Iron and Percocet Condition: stable Instructions: refer to practice specific booklet Discharge to: home   Newborn Data: Live born female  Birth Weight: 8 lb 9.4 oz (3895 g) APGAR: 9, 9  Newborn Delivery   Birth date/time:  08/25/2017 02:51:00 Delivery type:  C-Section, Low Transverse Trial of labor:  No C-section categorization:  Primary     Home with mother.  Mehkai Gallo 08/27/2017, 10:04 AM

## 2017-08-27 NOTE — Progress Notes (Signed)
Subjective: Postpartum Day 2: Cesarean Delivery Patient reports tolerating PO, + flatus and no problems voiding.  Patient would like discharge home today.  Ambulating frequently without orthostatic symptoms.   Objective: Vital signs in last 24 hours: Temp:  [98.1 F (36.7 C)-98.5 F (36.9 C)] 98.1 F (36.7 C) (08/24 0508) Pulse Rate:  [81-103] 81 (08/24 0508) Resp:  [16-18] 16 (08/24 0508) BP: (96-128)/(47-67) 96/47 (08/24 0508) SpO2:  [98 %-100 %] 100 % (08/24 0508)  Physical Exam:  General: alert and cooperative Lochia: appropriate Uterine Fundus: firm Incision: healing well, no significant drainage, no dehiscence DVT Evaluation: No evidence of DVT seen on physical exam. Negative Homan's sign. No cords or calf tenderness.  Recent Labs    08/25/17 0633 08/26/17 0546  HGB 9.4* 7.1*  HCT 27.4* 21.4*    Assessment/Plan: Status post Cesarean section. Doing well postoperatively.  Discharge home with standard precautions and return to clinic in 4-6 weeks. ABL anemia-FeSO4.   Courtney Wyatt 08/27/2017, 9:59 AM

## 2017-08-27 NOTE — Discharge Instructions (Signed)
Call MD for T>100.4, heavy vaginal bleeding, severe abdominal pain, intractable nausea and/or vomiting, or respiratory distress.  Call office to schedule postop incision check in 1 week.  No driving while taking narcotics.  Pelvic rest x 6 weeks.   °

## 2017-08-27 NOTE — Lactation Note (Signed)
This note was copied from a baby's chart. Lactation Consultation Note  Patient Name: Boy Courtney Wyatt ZOXWR'UToday's Date: 08/27/2017 Reason for consult: Follow-up assessment;Nipple pain/trauma;Term;Primapara;1st time breastfeeding;Infant weight loss;Other (Comment)  Baby is 7357 hours old  LC reviewed and updated the doc flow sheets per mom  As LC entered the room, dad holding baby and mom resting in bed.  Mom mentioned she felt breast feeding was going well except for sore nipples.  LC offered to assess tissue , and mom receptive, noted both nipples to be pinky,  No breakdown, breast full and warm , not engorged, and areolas semi compressible. Mom  already has been wearing shells between feedings, using coconut oil,  And has comfort gels, also a hand pump.  LC noted the areolas to be semi compressible and due to soreness , recommended until soreness  Improves 1st breast - breast massage,hand express, pre-pump to make the nipple / areola complex more elastic and then reverse pressure.  Sore nipple and engorgement prevention and tx reviewed.  After prepping nipples and areolas for latch, latch with firm support, and breast compressions until the baby is swallowing and then intermittent.  STS feedings until the baby is back to birth weight, gaining steadily, and can stay awake for a feeding.  Parents concerned about the baby's recessed chin, LC showed dad how he could help to ease  The chin down with latch so its more comfortable for mom.  Mom initially had some discomfort with latch , easing the chin helped and mom was comfortable the remainder of the feeding.  Both mom and dad receptive to review and assistance with latch and expressed appreciation.  Mother informed of post-discharge support and given phone number to the lactation department, including services for phone call assistance; out-patient appointments; and breastfeeding support group. List of other breastfeeding resources in the community  given in the handout. Encouraged mother to call for problems or concerns related to breastfeeding.   Maternal Data Has patient been taught Hand Expression?: Yes  Feeding Feeding Type: Breast Fed Length of feed: (still feeding at 16 mins , multiple swallows )  LATCH Score Latch: Grasps breast easily, tongue down, lips flanged, rhythmical sucking.  Audible Swallowing: Spontaneous and intermittent  Type of Nipple: Everted at rest and after stimulation  Comfort (Breast/Nipple): Filling, red/small blisters or bruises, mild/mod discomfort  Hold (Positioning): Assistance needed to correctly position infant at breast and maintain latch.  LATCH Score: 8  Interventions Interventions: Breast feeding basics reviewed;Assisted with latch;Skin to skin;Breast massage;Hand express;Pre-pump if needed;Reverse pressure;Breast compression;Adjust position;Support pillows;Position options;Expressed milk;Coconut oil;Shells;Comfort gels;Hand pump  Lactation Tools Discussed/Used Tools: Shells;Pump;Comfort gels;Coconut oil Shell Type: Inverted Breast pump type: Manual Pump Review: Setup, frequency, and cleaning;Milk Storage Initiated by:: LC reviewed  Date initiated:: 08/27/17   Consult Status Consult Status: Complete Date: 08/27/17 Follow-up type: In-patient    [name] 08/27/2017, 11:48 AM

## 2017-08-27 NOTE — Progress Notes (Signed)
CSW received consult for Courtney Wyatt due to history of anxiety and depression. CSW met with Courtney Wyatt, FOB Dylan, and baby Conor at bedside to complete discussion. CSW obtained permission from Courtney Wyatt to discuss with FOB present. Courtney Wyatt and CSW confirmed history of anxiety and depression but states it is not currently a concern for her. Courtney Wyatt reports being on Celexa prior to becoming pregnancy but hasn't been on medications for over two years. Courtney Wyatt reports being comfortable reaching out to her OB or PCP for medication if needs or concerns arise. Courtney Wyatt reports being very aware of her symptoms and triggers. Courtney Wyatt reports no concerns at this time with a stable mood since delivery. CSW and Courtney Wyatt discussed postpartum depression versus baby blues period.  Madilyn Fireman, MSW, [place] Social Worker [facility] [phone]

## 2017-09-06 DIAGNOSIS — R309 Painful micturition, unspecified: Secondary | ICD-10-CM | POA: Diagnosis not present

## 2017-09-06 DIAGNOSIS — N390 Urinary tract infection, site not specified: Secondary | ICD-10-CM | POA: Diagnosis not present

## 2017-10-04 DIAGNOSIS — Z1389 Encounter for screening for other disorder: Secondary | ICD-10-CM | POA: Diagnosis not present

## 2018-01-09 ENCOUNTER — Other Ambulatory Visit: Payer: Self-pay

## 2018-01-09 ENCOUNTER — Ambulatory Visit (INDEPENDENT_AMBULATORY_CARE_PROVIDER_SITE_OTHER): Payer: BLUE CROSS/BLUE SHIELD | Admitting: Family Medicine

## 2018-01-09 ENCOUNTER — Encounter: Payer: Self-pay | Admitting: Family Medicine

## 2018-01-09 VITALS — BP 112/70 | HR 80 | Temp 97.9°F | Ht 63.0 in | Wt 143.2 lb

## 2018-01-09 DIAGNOSIS — B372 Candidiasis of skin and nail: Secondary | ICD-10-CM

## 2018-01-09 DIAGNOSIS — F419 Anxiety disorder, unspecified: Secondary | ICD-10-CM

## 2018-01-09 MED ORDER — NYSTATIN 100000 UNIT/GM EX CREA: 1.0000 "application " | TOPICAL_CREAM | Freq: Two times a day (BID) | CUTANEOUS | 0 refills | Status: DC

## 2018-01-09 MED ORDER — SERTRALINE HCL 50 MG PO TABS: ORAL_TABLET | ORAL | 0 refills | Status: DC

## 2018-01-09 NOTE — Progress Notes (Signed)
  Subjective:     Patient ID: Courtney Wyatt, female   DOB: April 06, 1985, 33 y.o.   MRN: [id number] Chief Complaint  Patient presents with  . Anxiety    just had a baby in august, was on citalapram and went off meds when trying to get pregnant.  off all her meds for ADHD and anxiety/depression  . possible yeast    in between breast since 01/02/18  HPI States she is having trouble making decisions and focusing.Denies depression. She is currently breast feeding and anticipates continuing for another 3-4 months. Reports itchy rash between her breasts. Continues to work as a Scientist, clinical (histocompatibility and immunogenetics). Previously on citalopram Review of Systems     Objective:   Physical Exam Constitutional:      General: She is not in acute distress. Skin:    Findings: Rash (a few small papules between her breasts; no scaling) present.  Neurological:     Mental Status: She is alert.  Psychiatric:        Mood and Affect: Mood normal.        Behavior: Behavior normal.        Assessment:    1. Anxiety - sertraline (ZOLOFT) 50 MG tablet; Start at 1/2 pill for the first 5-7 days then one pill daily.  Dispense: 30 tablet; Refill: 0 (Per Pharmacopoeia-approved in breast feeding)  2. Candidal dermatitis - nystatin cream (MYCOSTATIN); Apply 1 application topically 2 (two) times daily.  Dispense: 30 g; Refill: 0    Plan:    Phone f/u in 2 weeks.

## 2018-01-09 NOTE — Patient Instructions (Addendum)
Phone f/u in two weeks. 

## 2018-02-09 ENCOUNTER — Telehealth: Payer: Self-pay | Admitting: Family Medicine

## 2018-02-09 NOTE — Telephone Encounter (Signed)
Pt calling to have Nadine Counts give her a call back.  Thanks, [employer]

## 2018-02-10 ENCOUNTER — Other Ambulatory Visit: Payer: Self-pay | Admitting: Family Medicine

## 2018-02-10 DIAGNOSIS — F419 Anxiety disorder, unspecified: Secondary | ICD-10-CM

## 2018-02-10 MED ORDER — SERTRALINE HCL 50 MG PO TABS: ORAL_TABLET | ORAL | 1 refills | Status: DC

## 2018-02-10 NOTE — Telephone Encounter (Signed)
Reports sertraline 50 mg controlling her anxiety well and wishes to stay on it. Will refill to [facility].

## 2018-02-10 NOTE — Telephone Encounter (Signed)
Please review

## 2018-02-13 IMAGING — CR DG CHEST 2V
1 series · 2 of 2 positions shown · non-contrast
Comparison: None.

CLINICAL DATA: Vomiting

EXAM:
CHEST  2 VIEW

[Series 1: w chest pa · 0.14mm/px · 2 of 2 slices shown]
[im 1/2]
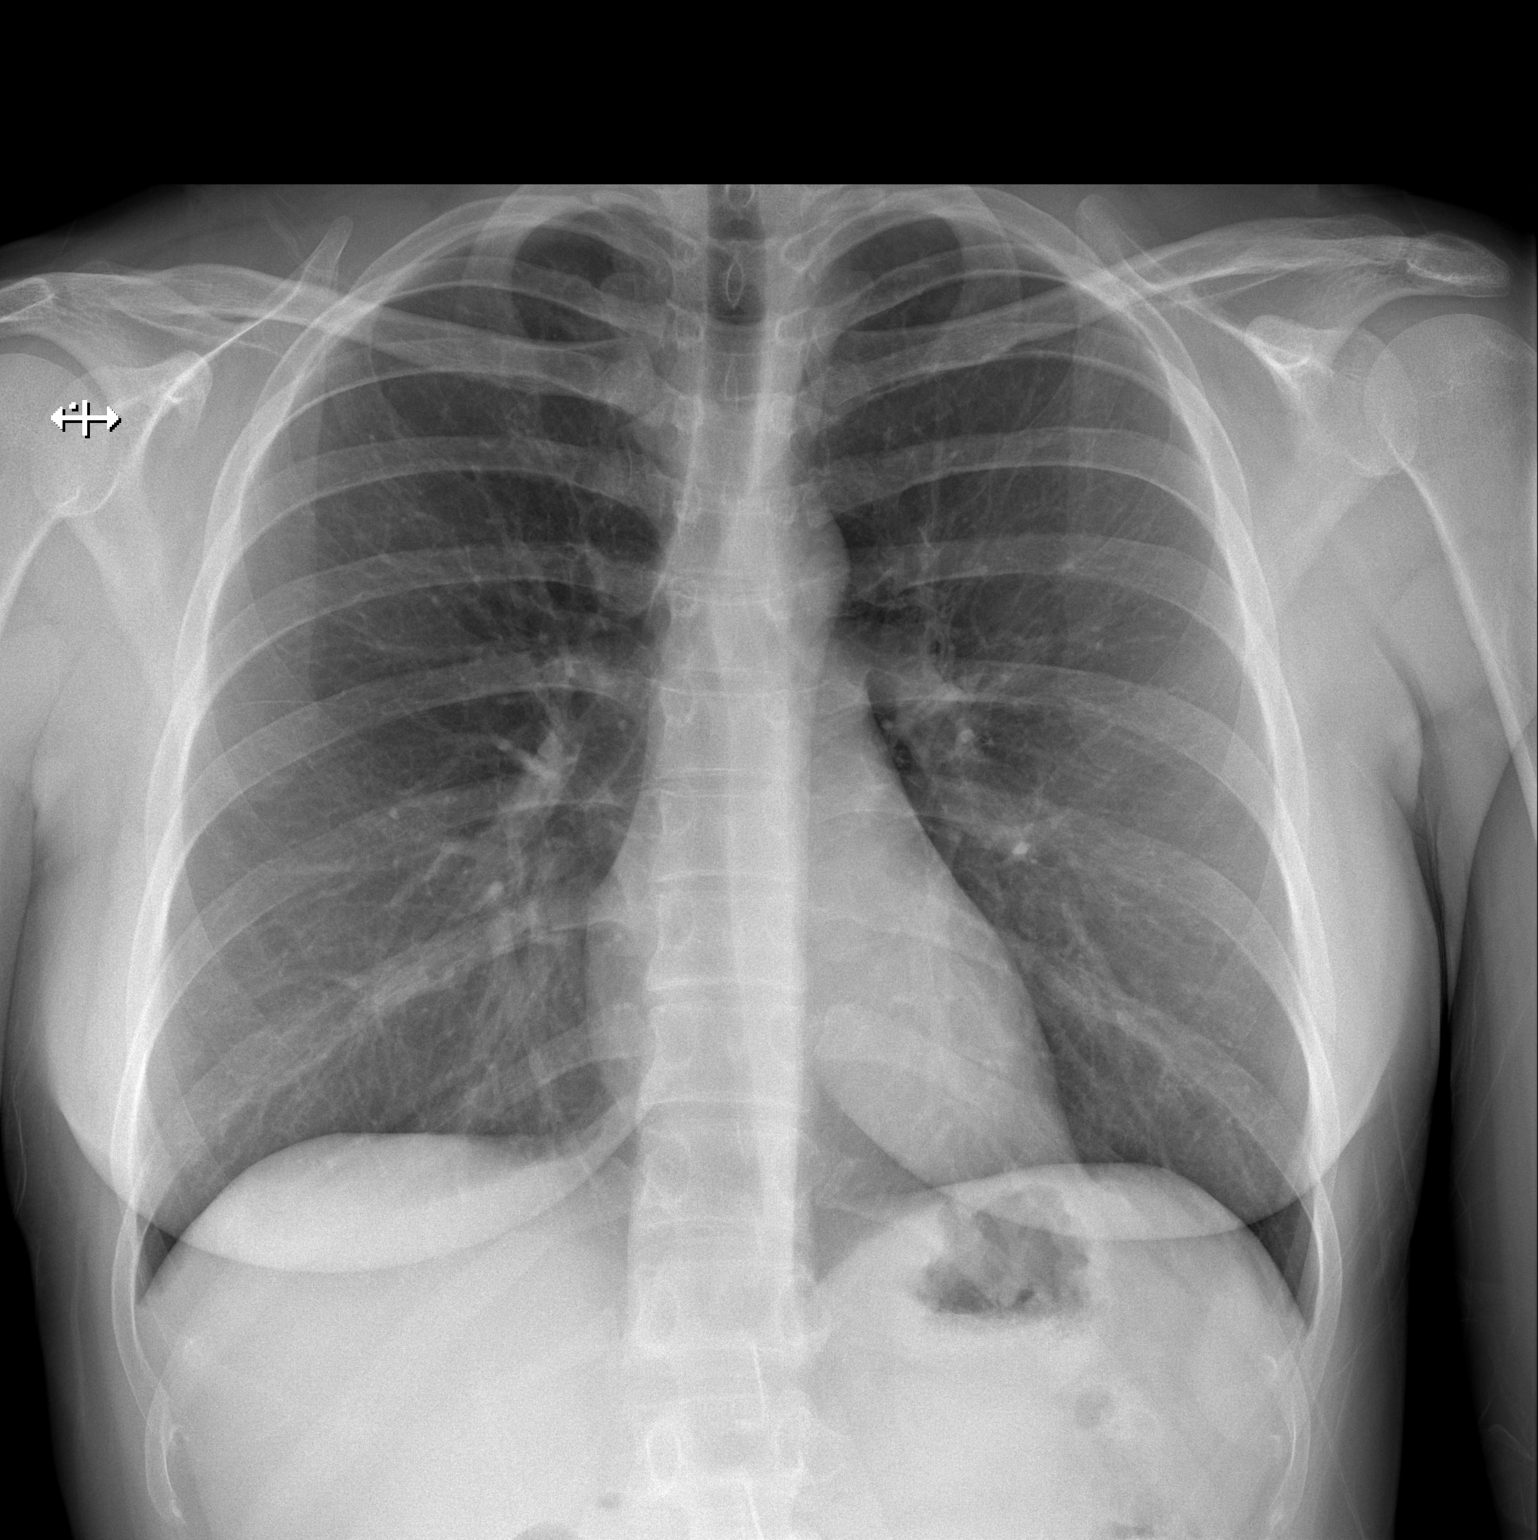
[im 2/2]
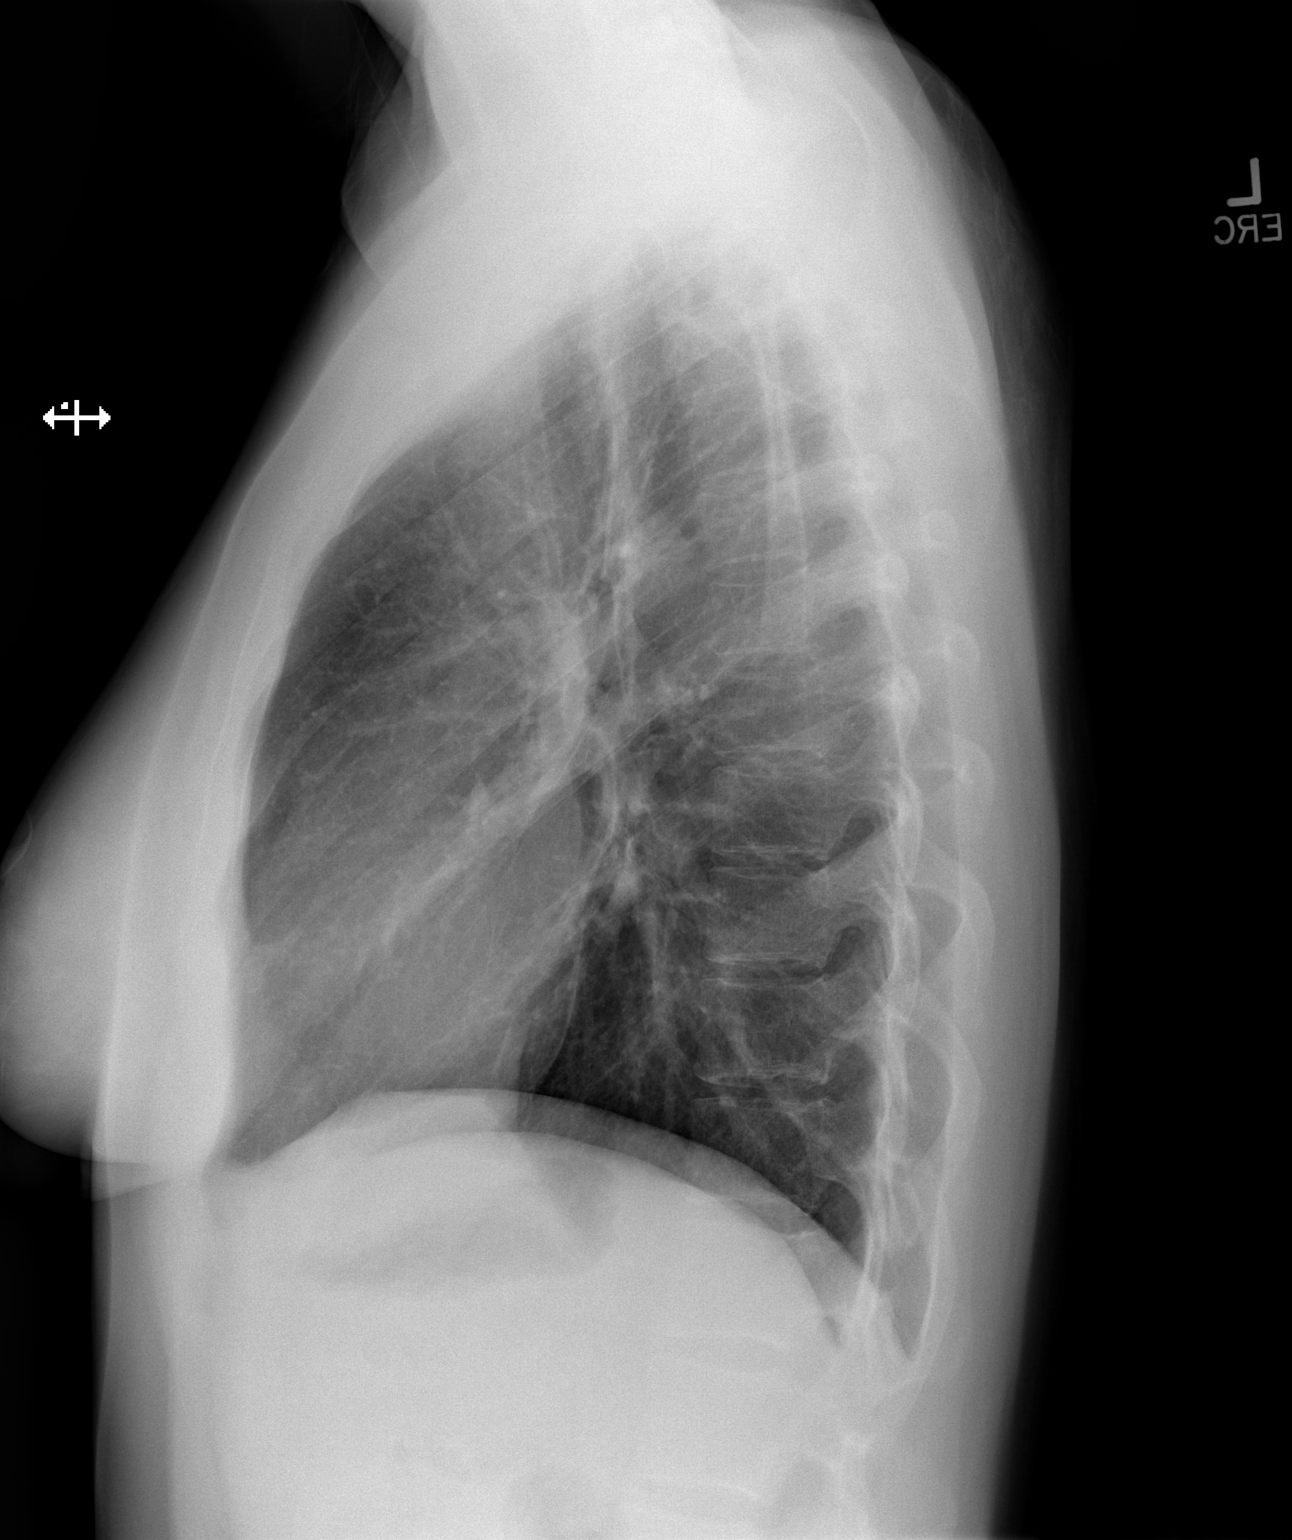

[2 of 2 positions shown; findings below may reference images not displayed]

FINDINGS: The heart size and mediastinal contours are within normal limits.
Both lungs are clear. The visualized skeletal structures are
unremarkable.
IMPRESSION: No active cardiopulmonary disease.

## 2018-07-18 ENCOUNTER — Other Ambulatory Visit: Payer: Self-pay | Admitting: Gastroenterology

## 2018-07-18 MED ORDER — SCOPOLAMINE 1 MG/3DAYS TD PT72: 1.0000 | MEDICATED_PATCH | TRANSDERMAL | 3 refills | Status: DC

## 2018-08-31 NOTE — Progress Notes (Signed)
Patient: Courtney Wyatt Female    DOB: Nov 27, 1985   33 y.o.   MRN: [id number] Visit Date: 09/01/2018  Today's Provider: Trinna Post, PA-C   Chief Complaint  Patient presents with  . Dysuria   Subjective:     Dysuria  This is a new problem. The problem has been unchanged. The quality of the pain is described as burning. The pain is mild. There has been no fever. Associated symptoms include frequency. Pertinent negatives include no chills, discharge, flank pain, hematuria, hesitancy, nausea, possible pregnancy, sweats, urgency or vomiting. She has tried nothing for the symptoms.     Patient is here concerning symptoms of burning upon urination for 3 days. Patient also has symptoms of frequency. She has a history of interstitial cystitis and sometimes gets flares and is unable to determine if it's a flare or UTI.   No Known Allergies   Current Outpatient Medications:  .  ibuprofen (ADVIL,MOTRIN) 800 MG tablet, Take 1 tablet (800 mg total) by mouth every 8 (eight) hours as needed for moderate pain., Disp: 30 tablet, Rfl: 0 .  Prenatal Vit-Fe Fumarate-FA (MULTIVITAMIN-PRENATAL) 27-0.8 MG TABS tablet, Take 1 tablet by mouth at bedtime. , Disp: , Rfl:  .  scopolamine (TRANSDERM-SCOP, 1.5 MG,) 1 MG/3DAYS, Place 1 patch (1.5 mg total) onto the skin every 3 (three) days., Disp: 10 patch, Rfl: 3 .  sertraline (ZOLOFT) 50 MG tablet, one pill daily., Disp: 90 tablet, Rfl: 1 .  docusate sodium (COLACE) 100 MG capsule, Take 100 mg by mouth at bedtime., Disp: , Rfl:  .  ferrous sulfate 325 (65 FE) MG tablet, Take 1 tablet (325 mg total) by mouth 2 (two) times daily with a meal. (Patient not taking: Reported on 01/09/2018), Disp: 30 tablet, Rfl: 0 .  nystatin cream (MYCOSTATIN), Apply 1 application topically 2 (two) times daily. (Patient not taking: Reported on 09/01/2018), Disp: 30 g, Rfl: 0 .  oxyCODONE-acetaminophen (PERCOCET/ROXICET) 5-325 MG tablet, Take 1 tablet by mouth every 4 (four)  hours as needed (pain scale 4-7). (Patient not taking: Reported on 01/09/2018), Disp: 30 tablet, Rfl: 0 .  ranitidine (ZANTAC) 75 MG tablet, Take 75 mg by mouth at bedtime., Disp: , Rfl:  .  sulfamethoxazole-trimethoprim (BACTRIM DS) 800-160 MG tablet, Take 1 tablet by mouth 2 (two) times daily for 3 days., Disp: 6 tablet, Rfl: 0  Review of Systems  Constitutional: Negative for appetite change, chills, fatigue and fever.  Respiratory: Negative for chest tightness and shortness of breath.   Cardiovascular: Negative for chest pain and palpitations.  Gastrointestinal: Negative for abdominal pain, nausea and vomiting.  Genitourinary: Positive for dysuria and frequency. Negative for flank pain, hematuria, hesitancy and urgency.  Neurological: Negative for dizziness and weakness.    Social History   Tobacco Use  . Smoking status: Former Smoker    Quit date: 10/21/2015    Years since quitting: 2.8  . Smokeless tobacco: Never Used  Substance Use Topics  . Alcohol use: Yes      Objective:   BP 116/78 (BP Location: Right Arm, Patient Position: Sitting, Cuff Size: Large)   Pulse 79   Temp 98 F (36.7 C) (Oral)   Resp 16   Ht 5\' 3"  (1.6 m)   Wt 155 lb (70.3 kg)   SpO2 98%   BMI 27.46 kg/m  Vitals:   09/01/18 0817  BP: 116/78  Pulse: 79  Resp: 16  Temp: 98 F (36.7 C)  TempSrc: Oral  SpO2: 98%  Weight: 155 lb (70.3 kg)  Height: 5\' 3"  (1.6 m)     Physical Exam Constitutional:      Appearance: Normal appearance.  Cardiovascular:     Rate and Rhythm: Normal rate and regular rhythm.     Heart sounds: Normal heart sounds.  Pulmonary:     Effort: Pulmonary effort is normal.     Breath sounds: Normal breath sounds.  Abdominal:     General: Bowel sounds are normal.     Palpations: Abdomen is soft.     Tenderness: There is no right CVA tenderness or left CVA tenderness.  Skin:    General: Skin is warm and dry.  Neurological:     Mental Status: She is alert and oriented to  person, place, and time. Mental status is at baseline.  Psychiatric:        Mood and Affect: Mood normal.        Behavior: Behavior normal.      Results for orders placed or performed in visit on 09/01/18  POCT urinalysis dipstick  Result Value Ref Range   Color, UA Yellow    Clarity, UA Clear    Glucose, UA Negative Negative   Bilirubin, UA Neg    Ketones, UA Neg    Spec Grav, UA 1.010 1.010 - 1.025   Blood, UA Neg    pH, UA 7.0 5.0 - 8.0   Protein, UA Positive (A) Negative   Urobilinogen, UA 0.2 0.2 or 1.0 E.U./dL   Nitrite, UA Neg    Leukocytes, UA Trace (A) Negative   Appearance Normal    Odor Normal        Assessment & Plan    1. Cystitis  UA with leukocytes but no blood or nitrites. I think she can wait until the culture returns and she is comfortable with this. I will send in Rx in case symptoms worsen over the weekend.  - CULTURE, URINE COMPREHENSIVE  - sulfamethoxazole-trimethoprim (BACTRIM DS) 800-160 MG tablet; Take 1 tablet by mouth 2 (two) times daily for 3 days.  Dispense: 6 tablet; Refill: 0  2. Dysuria  - POCT urinalysis dipstick  The entirety of the information documented in the History of Present Illness, Review of Systems and Physical Exam were personally obtained by me. Portions of this information were initially documented by April M. Hyacinth MeekerMiller, CMA and reviewed by me for thoroughness and accuracy.   Return if symptoms worsen or fail to improve.     Trey SailorsAdriana M Domanique Huesman, PA-C  [facility]

## 2018-09-01 ENCOUNTER — Ambulatory Visit (INDEPENDENT_AMBULATORY_CARE_PROVIDER_SITE_OTHER): Payer: BC Managed Care – PPO | Admitting: Physician Assistant

## 2018-09-01 ENCOUNTER — Other Ambulatory Visit: Payer: Self-pay

## 2018-09-01 ENCOUNTER — Encounter: Payer: Self-pay | Admitting: Physician Assistant

## 2018-09-01 VITALS — BP 116/78 | HR 79 | Temp 98.0°F | Resp 16 | Ht 63.0 in | Wt 155.0 lb

## 2018-09-01 DIAGNOSIS — R300 Dysuria: Secondary | ICD-10-CM | POA: Diagnosis not present

## 2018-09-01 DIAGNOSIS — N3090 Cystitis, unspecified without hematuria: Secondary | ICD-10-CM | POA: Diagnosis not present

## 2018-09-01 LAB — POCT URINALYSIS DIPSTICK
Appearance: NORMAL
Bilirubin, UA: NEGATIVE
Blood, UA: NEGATIVE
Glucose, UA: NEGATIVE
Ketones, UA: NEGATIVE
Nitrite, UA: NEGATIVE
Odor: NORMAL
Protein, UA: POSITIVE — AB
Spec Grav, UA: 1.01 (ref 1.010–1.025)
Urobilinogen, UA: 0.2 E.U./dL
pH, UA: 7 (ref 5.0–8.0)

## 2018-09-01 MED ORDER — SULFAMETHOXAZOLE-TRIMETHOPRIM 800-160 MG PO TABS: 1.0000 | ORAL_TABLET | Freq: Two times a day (BID) | ORAL | 0 refills | Status: AC

## 2018-09-01 NOTE — Patient Instructions (Signed)

## 2018-09-04 ENCOUNTER — Telehealth: Payer: Self-pay

## 2018-09-04 DIAGNOSIS — N3090 Cystitis, unspecified without hematuria: Secondary | ICD-10-CM

## 2018-09-04 NOTE — Telephone Encounter (Signed)
-----   Message from Trinna Post, [place] sent at 09/04/2018  4:38 PM EDT ----- Her urine culture is growing an infection. Susceptibilities are not back yet. This bacteria tends to be resistant to antibiotics so if she is still feeling ok we can wait until it comes back for sure so I know what to pick. If she is not, I will pick something I think will work.

## 2018-09-04 NOTE — Telephone Encounter (Signed)
LVMTRC 

## 2018-09-05 LAB — CULTURE, URINE COMPREHENSIVE

## 2018-09-05 MED ORDER — SULFAMETHOXAZOLE-TRIMETHOPRIM 400-80 MG PO TABS: 1.0000 | ORAL_TABLET | Freq: Two times a day (BID) | ORAL | 0 refills | Status: AC

## 2018-09-05 NOTE — Telephone Encounter (Signed)
Patient returned call and states that her symptoms are getting worse. Patient would like to know how long it is going to take for the susceptibilities to come back. She states she is having painful urination.Please advise.

## 2018-09-05 NOTE — Telephone Encounter (Signed)
Patient advised as below.  

## 2018-09-05 NOTE — Telephone Encounter (Signed)
I am not sure. I will send her in some bactrim one pill twice daily for 5 days if she would like to start. I can update this once sensitivities return.

## 2018-09-06 ENCOUNTER — Other Ambulatory Visit: Payer: Self-pay | Admitting: Physician Assistant

## 2018-09-06 DIAGNOSIS — F419 Anxiety disorder, unspecified: Secondary | ICD-10-CM

## 2018-09-06 MED ORDER — SERTRALINE HCL 50 MG PO TABS: ORAL_TABLET | ORAL | 1 refills | Status: DC

## 2018-09-06 NOTE — Telephone Encounter (Signed)
[facility] faxed refill request for the following medications:  sertraline (ZOLOFT) 50 MG tablet    Please advise.

## 2018-09-07 ENCOUNTER — Encounter: Payer: Self-pay | Admitting: Physician Assistant

## 2018-09-07 DIAGNOSIS — B373 Candidiasis of vulva and vagina: Secondary | ICD-10-CM

## 2018-09-07 DIAGNOSIS — B3731 Acute candidiasis of vulva and vagina: Secondary | ICD-10-CM

## 2018-09-07 MED ORDER — FLUCONAZOLE 150 MG PO TABS: ORAL_TABLET | ORAL | 0 refills | Status: DC

## 2018-10-31 ENCOUNTER — Other Ambulatory Visit: Payer: Self-pay | Admitting: Obstetrics & Gynecology

## 2018-10-31 DIAGNOSIS — N926 Irregular menstruation, unspecified: Secondary | ICD-10-CM

## 2018-11-14 ENCOUNTER — Ambulatory Visit (INDEPENDENT_AMBULATORY_CARE_PROVIDER_SITE_OTHER): Payer: BC Managed Care – PPO

## 2018-11-14 ENCOUNTER — Ambulatory Visit (INDEPENDENT_AMBULATORY_CARE_PROVIDER_SITE_OTHER): Payer: BC Managed Care – PPO | Admitting: Obstetrics & Gynecology

## 2018-11-14 ENCOUNTER — Encounter: Payer: Self-pay | Admitting: Obstetrics & Gynecology

## 2018-11-14 ENCOUNTER — Other Ambulatory Visit (HOSPITAL_COMMUNITY)
Admission: RE | Admit: 2018-11-14 | Discharge: 2018-11-14 | Disposition: A | Discharge status: Home / Self Care | Payer: BC Managed Care – PPO | Source: Ambulatory Visit | Attending: Obstetrics & Gynecology | Admitting: Obstetrics & Gynecology

## 2018-11-14 ENCOUNTER — Other Ambulatory Visit: Payer: Self-pay

## 2018-11-14 VITALS — BP 118/70 | Wt 160.0 lb

## 2018-11-14 DIAGNOSIS — Z369 Encounter for antenatal screening, unspecified: Secondary | ICD-10-CM | POA: Insufficient documentation

## 2018-11-14 DIAGNOSIS — N8311 Corpus luteum cyst of right ovary: Secondary | ICD-10-CM | POA: Diagnosis not present

## 2018-11-14 DIAGNOSIS — Z3A08 8 weeks gestation of pregnancy: Secondary | ICD-10-CM

## 2018-11-14 DIAGNOSIS — O3481 Maternal care for other abnormalities of pelvic organs, first trimester: Secondary | ICD-10-CM

## 2018-11-14 DIAGNOSIS — O34219 Maternal care for unspecified type scar from previous cesarean delivery: Secondary | ICD-10-CM

## 2018-11-14 DIAGNOSIS — Z98891 History of uterine scar from previous surgery: Secondary | ICD-10-CM | POA: Insufficient documentation

## 2018-11-14 DIAGNOSIS — N926 Irregular menstruation, unspecified: Secondary | ICD-10-CM

## 2018-11-14 DIAGNOSIS — Z3A01 Less than 8 weeks gestation of pregnancy: Secondary | ICD-10-CM | POA: Diagnosis not present

## 2018-11-14 DIAGNOSIS — O0990 Supervision of high risk pregnancy, unspecified, unspecified trimester: Secondary | ICD-10-CM | POA: Insufficient documentation

## 2018-11-14 DIAGNOSIS — Z3490 Encounter for supervision of normal pregnancy, unspecified, unspecified trimester: Secondary | ICD-10-CM

## 2018-11-14 MED ORDER — DOXYLAMINE-PYRIDOXINE 10-10 MG PO TBEC: 2.0000 | DELAYED_RELEASE_TABLET | Freq: Every day | ORAL | 5 refills | Status: DC

## 2018-11-14 NOTE — Patient Instructions (Signed)
First Trimester of Pregnancy The first trimester of pregnancy is from week 1 until the end of week 13 (months 1 through 3). A week after a sperm fertilizes an egg, the egg will implant on the wall of the uterus. This embryo will begin to develop into a baby. Genes from you and your partner will form the baby. The female genes will determine whether the baby will be a boy or a girl. At 6-8 weeks, the eyes and face will be formed, and the heartbeat can be seen on ultrasound. At the end of 12 weeks, all the baby's organs will be formed. Now that you are pregnant, you will want to do everything you can to have a healthy baby. Two of the most important things are to get good prenatal care and to follow your health care provider's instructions. Prenatal care is all the medical care you receive before the baby's birth. This care will help prevent, find, and treat any problems during the pregnancy and childbirth. Body changes during your first trimester Your body goes through many changes during pregnancy. The changes vary from woman to woman.  You may gain or lose a couple of pounds at first.  You may feel sick to your stomach (nauseous) and you may throw up (vomit). If the vomiting is uncontrollable, call your health care provider.  You may tire easily.  You may develop headaches that can be relieved by medicines. All medicines should be approved by your health care provider.  You may urinate more often. Painful urination may mean you have a bladder infection.  You may develop heartburn as a result of your pregnancy.  You may develop constipation because certain hormones are causing the muscles that push stool through your intestines to slow down.  You may develop hemorrhoids or swollen veins (varicose veins).  Your breasts may begin to grow larger and become tender. Your nipples may stick out more, and the tissue that surrounds them (areola) may become darker.  Your gums may bleed and may be  sensitive to brushing and flossing.  Dark spots or blotches (chloasma, mask of pregnancy) may develop on your face. This will likely fade after the baby is born.  Your menstrual periods will stop.  You may have a loss of appetite.  You may develop cravings for certain kinds of food.  You may have changes in your emotions from day to day, such as being excited to be pregnant or being concerned that something may go wrong with the pregnancy and baby.  You may have more vivid and strange dreams.  You may have changes in your hair. These can include thickening of your hair, rapid growth, and changes in texture. Some women also have hair loss during or after pregnancy, or hair that feels dry or thin. Your hair will most likely return to normal after your baby is born. What to expect at prenatal visits During a routine prenatal visit:  You will be weighed to make sure you and the baby are growing normally.  Your blood pressure will be taken.  Your abdomen will be measured to track your baby's growth.  The fetal heartbeat will be listened to between weeks 10 and 14 of your pregnancy.  Test results from any previous visits will be discussed. Your health care provider may ask you:  How you are feeling.  If you are feeling the baby move.  If you have had any abnormal symptoms, such as leaking fluid, bleeding, severe headaches, or abdominal   cramping.  If you are using any tobacco products, including cigarettes, chewing tobacco, and electronic cigarettes.  If you have any questions. Other tests that may be performed during your first trimester include:  Blood tests to find your blood type and to check for the presence of any previous infections. The tests will also be used to check for low iron levels (anemia) and protein on red blood cells (Rh antibodies). Depending on your risk factors, or if you previously had diabetes during pregnancy, you may have tests to check for high blood sugar  that affects pregnant women (gestational diabetes).  Urine tests to check for infections, diabetes, or protein in the urine.  An ultrasound to confirm the proper growth and development of the baby.  Fetal screens for spinal cord problems (spina bifida) and Down syndrome.  HIV (human immunodeficiency virus) testing. Routine prenatal testing includes screening for HIV, unless you choose not to have this test.  You may need other tests to make sure you and the baby are doing well. Follow these instructions at home: Medicines  Follow your health care provider's instructions regarding medicine use. Specific medicines may be either safe or unsafe to take during pregnancy.  Take a prenatal vitamin that contains at least 600 micrograms (mcg) of folic acid.  If you develop constipation, try taking a stool softener if your health care provider approves. Eating and drinking   Eat a balanced diet that includes fresh fruits and vegetables, whole grains, good sources of protein such as meat, eggs, or tofu, and low-fat dairy. Your health care provider will help you determine the amount of weight gain that is right for you.  Avoid raw meat and uncooked cheese. These carry germs that can cause birth defects in the baby.  Eating four or five small meals rather than three large meals a day may help relieve nausea and vomiting. If you start to feel nauseous, eating a few soda crackers can be helpful. Drinking liquids between meals, instead of during meals, also seems to help ease nausea and vomiting.  Limit foods that are high in fat and processed sugars, such as fried and sweet foods.  To prevent constipation: ? Eat foods that are high in fiber, such as fresh fruits and vegetables, whole grains, and beans. ? Drink enough fluid to keep your urine clear or pale yellow. Activity  Exercise only as directed by your health care provider. Most women can continue their usual exercise routine during  pregnancy. Try to exercise for 30 minutes at least 5 days a week. Exercising will help you: ? Control your weight. ? Stay in shape. ? Be prepared for labor and delivery.  Experiencing pain or cramping in the lower abdomen or lower back is a good sign that you should stop exercising. Check with your health care provider before continuing with normal exercises.  Try to avoid standing for long periods of time. Move your legs often if you must stand in one place for a long time.  Avoid heavy lifting.  Wear low-heeled shoes and practice good posture.  You may continue to have sex unless your health care provider tells you not to. Relieving pain and discomfort  Wear a good support bra to relieve breast tenderness.  Take warm sitz baths to soothe any pain or discomfort caused by hemorrhoids. Use hemorrhoid cream if your health care provider approves.  Rest with your legs elevated if you have leg cramps or low back pain.  If you develop varicose veins in   your legs, wear support hose. Elevate your feet for 15 minutes, 3-4 times a day. Limit salt in your diet. Prenatal care  Schedule your prenatal visits by the twelfth week of pregnancy. They are usually scheduled monthly at first, then more often in the last 2 months before delivery.  Write down your questions. Take them to your prenatal visits.  Keep all your prenatal visits as told by your health care provider. This is important. Safety  Wear your seat belt at all times when driving.  Make a list of emergency phone numbers, including numbers for family, friends, the hospital, and police and fire departments. General instructions  Ask your health care provider for a referral to a local prenatal education class. Begin classes no later than the beginning of month 6 of your pregnancy.  Ask for help if you have counseling or nutritional needs during pregnancy. Your health care provider can offer advice or refer you to specialists for help  with various needs.  Do not use hot tubs, steam rooms, or saunas.  Do not douche or use tampons or scented sanitary pads.  Do not cross your legs for long periods of time.  Avoid cat litter boxes and soil used by cats. These carry germs that can cause birth defects in the baby and possibly loss of the fetus by miscarriage or stillbirth.  Avoid all smoking, herbs, alcohol, and medicines not prescribed by your health care provider. Chemicals in these products affect the formation and growth of the baby.  Do not use any products that contain nicotine or tobacco, such as cigarettes and e-cigarettes. If you need help quitting, ask your health care provider. You may receive counseling support and other resources to help you quit.  Schedule a dentist appointment. At home, brush your teeth with a soft toothbrush and be gentle when you floss. Contact a health care provider if:  You have dizziness.  You have mild pelvic cramps, pelvic pressure, or nagging pain in the abdominal area.  You have persistent nausea, vomiting, or diarrhea.  You have a bad smelling vaginal discharge.  You have pain when you urinate.  You notice increased swelling in your face, hands, legs, or ankles.  You are exposed to fifth disease or chickenpox.  You are exposed to German measles (rubella) and have never had it. Get help right away if:  You have a fever.  You are leaking fluid from your vagina.  You have spotting or bleeding from your vagina.  You have severe abdominal cramping or pain.  You have rapid weight gain or loss.  You vomit blood or material that looks like coffee grounds.  You develop a severe headache.  You have shortness of breath.  You have any kind of trauma, such as from a fall or a car accident. Summary  The first trimester of pregnancy is from week 1 until the end of week 13 (months 1 through 3).  Your body goes through many changes during pregnancy. The changes vary from  woman to woman.  You will have routine prenatal visits. During those visits, your health care provider will examine you, discuss any test results you may have, and talk with you about how you are feeling. This information is not intended to replace advice given to you by your health care provider. Make sure you discuss any questions you have with your health care provider. Document Released: 12/15/2000 Document Revised: 12/03/2016 Document Reviewed: 12/03/2015 Elsevier Patient Education  2020 [employer]  

## 2018-11-14 NOTE — Progress Notes (Signed)
11/14/2018   Chief Complaint: Missed period  Transfer of Care Patient: no  History of Present Illness: Courtney Wyatt is a 33 y.o. G3P1011 [redacted]w[redacted]d based on Patient's last menstrual period was 09/18/2018 (exact date). with an Estimated Date of Delivery: 06/25/19, with the above CC.   Her periods were: regular periods every 28 days She was using no method when she conceived.  She has Positive signs or symptoms of nausea/vomiting of pregnancy. She has Negative signs or symptoms of miscarriage or preterm labor She identifies Negative Zika risk factors for her and her partner On any different medications around the time she conceived/early pregnancy: Yes (Zoloft) History of varicella: Yes   ROS: A 12-point review of systems was performed and negative, except as stated in the above HPI.  OBGYN History: As per HPI. OB History  Gravida Para Term Preterm AB Living  3 1 1  0 1 1  SAB TAB Ectopic Multiple Live Births  1 0 0 0 1    # Outcome Date GA Lbr Len/2nd Weight Sex Delivery Anes PTL Lv  3 Current           2 Term 08/25/17 [redacted]w[redacted]d  8 lb 9.4 oz (3.895 kg) M CS-LTranv EPI  LIV  1 SAB             Any issues with any prior pregnancies: yes, she had CS for FTP but felt she was not given enough time to push (1 hour) Any prior children are healthy, doing well, without any problems or issues: yes History of pap smears: Yes. Last pap smear 2018. Abnormal: no  History of STIs: No   Past Medical History: Past Medical History:  Diagnosis Date  . ADD (attention deficit disorder)   . Anxiety   . Bacterial vaginosis   . 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 History of Papanicolaou smear of cervix 07/10/2013; 10/21/2014   NEG; NEG  . 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 Interstitial cystitis   . PONV (postoperative nausea and vomiting)   . SAB (spontaneous abortion) 07/14/2016    Past Surgical History: Past Surgical History:  Procedure Laterality Date  . CESAREAN SECTION N/A 08/25/2017    Procedure: CESAREAN SECTION;  Surgeon: 08/27/2017, MD;  Location: [facility] BIRTHING SUIT[facility];  Service: Obstetrics;  Laterality: N/A;  . LAPAROSCOPY  04/2004  . TONSILLECTOMY  1994   DR.  05/2004  . WISDOM TOOTH EXTRACTION      Family History:  Family History  Problem Relation Age of Onset  . Hyperlipidemia Mother   . Depression Mother   . Hypertension Mother   . Hypertension Father   . Diabetes Father   . Breast cancer Maternal Aunt 65  . Lung cancer Maternal Grandmother    She denies any female cancers, bleeding or blood clotting disorders.  She denies any history of mental retardation, birth defects or genetic disorders in her or the FOB's history  Social History:  Social History   Socioeconomic History  . Marital status: Married    Spouse name: Not on file  . Number of children: 0  . Years of education: Not on file  . Highest education level: Not on file  Occupational History  . Not on file  Social Needs  . Financial resource strain: Not hard at all  . Food insecurity    Worry: Never true    Inability: Never true  . Transportation needs    Medical: No    Non-medical: No  Tobacco  Use  . Smoking status: Former Smoker    Quit date: 10/21/2015    Years since quitting: 3.0  . Smokeless tobacco: Never Used  Substance and Sexual Activity  . Alcohol use: Yes  . Drug use: No  . Sexual activity: Yes    Birth control/protection: None  Lifestyle  . Physical activity    Days per week: 0 days    Minutes per session: 0 min  . Stress: Not at all  Relationships  . Social Musicianconnections    Talks on phone: Patient refused    Gets together: Patient refused    Attends religious service: Patient refused    Active member of club or organization: Patient refused    Attends meetings of clubs or organizations: Patient refused    Relationship status: Patient refused  . Intimate partner violence    Fear of current or ex partner: No    Emotionally abused: No    Physically abused:  No    Forced sexual activity: No  Other Topics Concern  . Not on file  Social History Narrative  . Not on file   Any pets in the household: dog  Allergy: No Known Allergies  Current Outpatient Medications:  Current Outpatient Medications:  .  Prenatal Vit-Fe Fumarate-FA (MULTIVITAMIN-PRENATAL) 27-0.8 MG TABS tablet, Take 1 tablet by mouth at bedtime. , Disp: , Rfl:  .  sertraline (ZOLOFT) 50 MG tablet, one pill daily., Disp: 90 tablet, Rfl: 1 .  Doxylamine-Pyridoxine (DICLEGIS) 10-10 MG TBEC, Take 2 tablets by mouth at bedtime. If symptoms persist, add one tablet in the morning and one in the afternoon, Disp: 100 tablet, Rfl: 5   Physical Exam:   BP 118/70   Wt 160 lb (72.6 kg)   LMP 09/18/2018 (Exact Date)   BMI 28.34 kg/m  Body mass index is 28.34 kg/m. Constitutional: Well nourished, well developed female in no acute distress.  Neck:  Supple, normal appearance, and no thyromegaly  Cardiovascular: S1, S2 normal, no murmur, rub or gallop, regular rate and rhythm Respiratory:  Clear to auscultation bilateral. Normal respiratory effort Abdomen: positive bowel sounds and no masses, hernias; diffusely non tender to palpation, non distended Breasts: breasts appear normal, no suspicious masses, no skin or nipple changes or axillary nodes. Neuro/Psych:  Normal mood and affect.  Skin:  Warm and dry.  Lymphatic:  No inguinal lymphadenopathy.   Pelvic exam: is not limited by body habitus EGBUS: within normal limits, Vagina: within normal limits and with no blood in the vault, Cervix: normal appearing cervix without discharge or lesions, closed/long/high, Uterus:  enlarged: 8 weeks, and Adnexa:  no mass, fullness, tenderness  Assessment: Courtney Wyatt is a 33 y.o. G3P1011 6949w1d based on Patient's last menstrual period was 09/18/2018 (exact date). with an Estimated Date of Delivery: 06/25/19,  for prenatal care.  Plan:  1) Avoid alcoholic beverages. 2) Patient encouraged not to smoke.   3) Discontinue the use of all non-medicinal drugs and chemicals.  4) Take prenatal vitamins daily.  5) Seatbelt use advised 6) Nutrition, food safety (fish, cheese advisories, and high nitrite foods) and exercise discussed. 7) [facility] and [facility] discussed  8) Patient is asked about travel to areas at risk for the Zika virus, and counseled to avoid travel and exposure to mosquitoes or sexual partners who may have themselves been exposed to the virus. Testing is discussed, and will be ordered as appropriate.  9) [facility]birth [facility] advised 10) Genetic Screening, such as with  1st Trimester Screening, cell free fetal DNA, AFP testing, and Ultrasound, as well as with amniocentesis and CVS as appropriate, is discussed with patient. She plans to have not genetic testing this pregnancy.  Review of ULTRASOUND.    I have personally reviewed images and report of recent ultrasound done at [facility].    Plan of management to be discussed with patient.    EDC confirmed by LMP w todays [place]  Desires VBAC, discussed pros and cons  Zoloft for depression, 50 mg dose.  Counseled as to pros and cons of medicine.  This medicine has better rating in pregnancy and breastfeeding. She desires to continue on therapy.  Diclegis for nausea  Problem list reviewed and updated.  Barnett Applebaum, MD, [facility], [facility] 11/14/2018  2:00 PM

## 2018-11-15 LAB — RPR+RH+ABO+RUB AB+AB SCR+CB...
Antibody Screen: NEGATIVE
HIV Screen 4th Generation wRfx: NONREACTIVE
Hematocrit: 40.7 % (ref 34.0–46.6)
Hemoglobin: 13.5 g/dL (ref 11.1–15.9)
Hepatitis B Surface Ag: NEGATIVE
MCH: 30 pg (ref 26.6–33.0)
MCHC: 33.2 g/dL (ref 31.5–35.7)
MCV: 90 fL (ref 79–97)
Platelets: 299 10*3/uL (ref 150–450)
RBC: 4.5 x10E6/uL (ref 3.77–5.28)
RDW: 11.6 % — ABNORMAL LOW (ref 11.7–15.4)
RPR Ser Ql: NONREACTIVE
Rh Factor: POSITIVE
Rubella Antibodies, IGG: 4.36 index (ref >0.99)
Varicella zoster IgG: 461 index (ref >165)
WBC: 11.7 10*3/uL — ABNORMAL HIGH (ref 3.4–10.8)

## 2018-11-16 LAB — CERVICOVAGINAL ANCILLARY ONLY
Chlamydia: NEGATIVE
Comment: NEGATIVE
Comment: NORMAL
Neisseria Gonorrhea: NEGATIVE

## 2018-11-16 LAB — URINE CULTURE

## 2018-12-05 ENCOUNTER — Other Ambulatory Visit: Payer: Self-pay

## 2018-12-05 ENCOUNTER — Encounter: Payer: Self-pay | Admitting: Obstetrics & Gynecology

## 2018-12-05 ENCOUNTER — Ambulatory Visit (INDEPENDENT_AMBULATORY_CARE_PROVIDER_SITE_OTHER): Payer: BC Managed Care – PPO | Admitting: Obstetrics & Gynecology

## 2018-12-05 VITALS — Wt 161.0 lb

## 2018-12-05 DIAGNOSIS — O34219 Maternal care for unspecified type scar from previous cesarean delivery: Secondary | ICD-10-CM

## 2018-12-05 DIAGNOSIS — Z3490 Encounter for supervision of normal pregnancy, unspecified, unspecified trimester: Secondary | ICD-10-CM

## 2018-12-05 DIAGNOSIS — Z3A11 11 weeks gestation of pregnancy: Secondary | ICD-10-CM

## 2018-12-05 DIAGNOSIS — Z98891 History of uterine scar from previous surgery: Secondary | ICD-10-CM

## 2018-12-05 NOTE — Progress Notes (Signed)
Virtual Visit via Telephone Note  I connected with patient on 12/05/18 at 10:20 AM EST by telephone and verified that I am speaking with the correct person using two identifiers.   I discussed the limitations, risks, security and privacy concerns of performing an evaluation and management service by telephone and the availability of in person appointments. I also discussed with the patient that there may be a patient responsible charge related to this service. The patient expressed understanding and agreed to proceed.  The patient was at home I spoke with the patient from my  office  Courtney Wyatt is a 33 y.o. G3P1011 at [redacted]w[redacted]d being seen today for ongoing prenatal care.  She is currently monitored for the following issues for this low-risk pregnancy and has Anxiety; H/O suicide attempt; Chronic interstitial cystitis; History of cesarean delivery; and Encounter for supervision of low-risk pregnancy, antepartum on their problem list.  ----------------------------------------------------------------------------------- Patient reports no complaints and mild nausea repsoning to Diclegis.   Denies pain, VB, leaking of fluid.  ----------------------------------------------------------------------------------- The following portions of the patient's history were reviewed and updated as appropriate: allergies, current medications, past family history, past medical history, past social history, past surgical history and problem list. Problem list updated.   Objective  Physical Exam could not be performed. Because of the COVID-19 outbreak this visit was performed over the phone and not in person.  Reported VS- Wt 161 lb (73 kg)   LMP 09/18/2018 (Exact Date)   BMI 28.52 kg/m   Assessment   33 y.o. G3P1011 at [redacted]w[redacted]d by  06/25/2019, by Last Menstrual Period presenting for routine prenatal visit  Plan     ICD-10-CM   1. [redacted] weeks gestation of pregnancy  Z3A.11   2. Encounter for supervision of low-risk  pregnancy, antepartum  Z34.90   3. History of cesarean delivery  Z98.891   Declines NIPT  Gestational age appropriate obstetric precautions including but not limited to vaginal bleeding, contractions, leaking of fluid and fetal movement were reviewed in detail with the patient.     Follow Up Instructions: 4 weeks    I discussed the assessment and treatment plan with the patient. The patient was provided an opportunity to ask questions and all were answered. The patient agreed with the plan and demonstrated an understanding of the instructions.   The patient was advised to call back or seek an in-person evaluation if the symptoms worsen or if the condition fails to improve as anticipated.  I provided 8 minutes of non-face-to-face time during this encounter.  Return in about 4 weeks (around 01/02/2019) for [place], MD [place], [facility] 12/05/2018 10:34 AM

## 2019-01-04 ENCOUNTER — Other Ambulatory Visit: Payer: Self-pay

## 2019-01-04 ENCOUNTER — Encounter: Payer: Self-pay | Admitting: Obstetrics & Gynecology

## 2019-01-04 ENCOUNTER — Ambulatory Visit (INDEPENDENT_AMBULATORY_CARE_PROVIDER_SITE_OTHER): Payer: BC Managed Care – PPO | Admitting: Obstetrics & Gynecology

## 2019-01-04 VITALS — BP 120/80 | Wt 164.0 lb

## 2019-01-04 DIAGNOSIS — Z3490 Encounter for supervision of normal pregnancy, unspecified, unspecified trimester: Secondary | ICD-10-CM

## 2019-01-04 DIAGNOSIS — O34219 Maternal care for unspecified type scar from previous cesarean delivery: Secondary | ICD-10-CM

## 2019-01-04 DIAGNOSIS — Z3A15 15 weeks gestation of pregnancy: Secondary | ICD-10-CM

## 2019-01-04 DIAGNOSIS — Z98891 History of uterine scar from previous surgery: Secondary | ICD-10-CM

## 2019-01-04 DIAGNOSIS — Z3689 Encounter for other specified antenatal screening: Secondary | ICD-10-CM

## 2019-01-04 NOTE — Progress Notes (Signed)
  Subjective  Fetal Movement? yes Contractions? no Leaking Fluid? no Vaginal Bleeding? no Nausea? No Objective  BP 120/80   Wt 164 lb (74.4 kg)   LMP 09/18/2018 (Exact Date)   BMI 29.05 kg/m  General: NAD Pumonary: no increased work of breathing Abdomen: gravid, non-tender Extremities: no edema Psychiatric: mood appropriate, affect full  Assessment  33 y.o. G3P1011 at [redacted]w[redacted]d by  06/25/2019, by Last Menstrual Period presenting for routine prenatal visit  Plan   Problem List Items Addressed This Visit      Other   History of cesarean delivery   Encounter for supervision of low-risk pregnancy, antepartum    Other Visit Diagnoses    [redacted] weeks gestation of pregnancy    -  Primary   Screening, antenatal, for fetal anatomic survey       Relevant Orders   US OB Comp + 14 Wk    PNV [place] nv  Barnett Applebaum, MD, [place], [facility] 01/04/2019  1:24 PM

## 2019-01-05 NOTE — L&D Delivery Note (Signed)
Operative Delivery Note At 8:55 PM a viable child was delivered via Vaginal, Vacuum (Extractor).  Presentation: vertex; Position: Asynclitic to the Left,; Station: +2. Maternal exhaustion, outlet. 3 applications, 2 pop offs.  Delivery on third application  Verbal consent: obtained from patient.  Risks and benefits discussed in detail.  Risks include, but are not limited to the risks of anesthesia, bleeding, infection, damage to maternal tissues, fetal cephalhematoma.  There is also the risk of inability to effect vaginal delivery of the head, or shoulder dystocia that cannot be resolved by established maneuvers, leading to the need for emergency cesarean section.  Female, "Ellie" APGAR: 8, 9 ; weight  pending.   Placenta status: spon, intact .   Cord: 3V with the following complications: none  Anesthesia: Epidural   Instruments: Kiwi flat Episiotomy: None Lacerations: 1st degree Suture Repair: 2.0 vicryl Est. Blood Loss (mL):    Mom to postpartum.  Baby to Couplet care / Skin to Skin.  Letitia Libra 06/21/2019, 9:06 PM

## 2019-01-10 ENCOUNTER — Other Ambulatory Visit: Payer: Self-pay | Admitting: Obstetrics & Gynecology

## 2019-01-10 MED ORDER — BUTALBITAL-APAP-CAFFEINE 50-325-40 MG PO TABS: 1.0000 | ORAL_TABLET | ORAL | 2 refills | Status: DC | PRN

## 2019-01-11 ENCOUNTER — Ambulatory Visit: Payer: BC Managed Care – PPO | Attending: Internal Medicine

## 2019-01-11 DIAGNOSIS — Z20822 Contact with and (suspected) exposure to covid-19: Secondary | ICD-10-CM | POA: Diagnosis not present

## 2019-01-13 LAB — NOVEL CORONAVIRUS, NAA: SARS-CoV-2, NAA: NOT DETECTED

## 2019-02-07 ENCOUNTER — Ambulatory Visit (INDEPENDENT_AMBULATORY_CARE_PROVIDER_SITE_OTHER): Payer: BC Managed Care – PPO | Admitting: Obstetrics & Gynecology

## 2019-02-07 ENCOUNTER — Encounter: Payer: Self-pay | Admitting: Obstetrics & Gynecology

## 2019-02-07 ENCOUNTER — Ambulatory Visit (INDEPENDENT_AMBULATORY_CARE_PROVIDER_SITE_OTHER): Payer: BC Managed Care – PPO

## 2019-02-07 ENCOUNTER — Other Ambulatory Visit: Payer: Self-pay

## 2019-02-07 VITALS — BP 100/60 | Wt 166.0 lb

## 2019-02-07 DIAGNOSIS — Z98891 History of uterine scar from previous surgery: Secondary | ICD-10-CM

## 2019-02-07 DIAGNOSIS — O321XX0 Maternal care for breech presentation, not applicable or unspecified: Secondary | ICD-10-CM | POA: Diagnosis not present

## 2019-02-07 DIAGNOSIS — Z3689 Encounter for other specified antenatal screening: Secondary | ICD-10-CM | POA: Diagnosis not present

## 2019-02-07 DIAGNOSIS — O34219 Maternal care for unspecified type scar from previous cesarean delivery: Secondary | ICD-10-CM

## 2019-02-07 DIAGNOSIS — Z3A19 19 weeks gestation of pregnancy: Secondary | ICD-10-CM

## 2019-02-07 DIAGNOSIS — Z3490 Encounter for supervision of normal pregnancy, unspecified, unspecified trimester: Secondary | ICD-10-CM

## 2019-02-07 DIAGNOSIS — Z3A20 20 weeks gestation of pregnancy: Secondary | ICD-10-CM

## 2019-02-07 LAB — POCT URINALYSIS DIPSTICK OB
Glucose, UA: NEGATIVE
POC,PROTEIN,UA: NEGATIVE

## 2019-02-07 NOTE — Patient Instructions (Signed)

## 2019-02-07 NOTE — Progress Notes (Signed)
  Subjective  Fetal Movement? yes Contractions? no Nausea? mild Vaginal Bleeding? no  Objective  BP 100/60   Wt 166 lb (75.3 kg)   LMP 09/18/2018 (Exact Date)   BMI 29.41 kg/m  General: NAD Pumonary: no increased work of breathing Abdomen: gravid, non-tender Extremities: no edema Psychiatric: mood appropriate, affect full  Assessment  34 y.o. G3P1011 at [redacted]w[redacted]d by  06/25/2019, by Last Menstrual Period presenting for routine prenatal visit  Plan   Problem List Items Addressed This Visit      Other   History of cesarean delivery   Encounter for supervision of low-risk pregnancy, antepartum    Other Visit Diagnoses    [redacted] weeks gestation of pregnancy    -  Primary   Relevant Orders   POC Urinalysis Dipstick OB (Completed)    Plan CS if >40 weeks and no s/sx labor for a VBAC PNV  Annamarie Major, MD, [facility], [facility] 02/07/2019  3:38 PM

## 2019-02-14 ENCOUNTER — Other Ambulatory Visit: Payer: Self-pay | Admitting: Certified Nurse Midwife

## 2019-02-14 MED ORDER — TERCONAZOLE 0.4 % VA CREA: 1.0000 | TOPICAL_CREAM | Freq: Every day | VAGINAL | 0 refills | Status: AC

## 2019-02-27 ENCOUNTER — Other Ambulatory Visit: Payer: Self-pay | Admitting: Physician Assistant

## 2019-02-27 DIAGNOSIS — F419 Anxiety disorder, unspecified: Secondary | ICD-10-CM

## 2019-03-09 ENCOUNTER — Other Ambulatory Visit: Payer: Self-pay

## 2019-03-09 ENCOUNTER — Encounter: Payer: Self-pay | Admitting: Obstetrics & Gynecology

## 2019-03-09 ENCOUNTER — Ambulatory Visit (INDEPENDENT_AMBULATORY_CARE_PROVIDER_SITE_OTHER): Payer: BC Managed Care – PPO | Admitting: Obstetrics & Gynecology

## 2019-03-09 DIAGNOSIS — Z3490 Encounter for supervision of normal pregnancy, unspecified, unspecified trimester: Secondary | ICD-10-CM

## 2019-03-09 DIAGNOSIS — Z3492 Encounter for supervision of normal pregnancy, unspecified, second trimester: Secondary | ICD-10-CM

## 2019-03-09 DIAGNOSIS — Z98891 History of uterine scar from previous surgery: Secondary | ICD-10-CM

## 2019-03-09 DIAGNOSIS — O34211 Maternal care for low transverse scar from previous cesarean delivery: Secondary | ICD-10-CM

## 2019-03-09 DIAGNOSIS — Z3A24 24 weeks gestation of pregnancy: Secondary | ICD-10-CM

## 2019-03-09 NOTE — Progress Notes (Signed)
Virtual Visit via Telephone Note  I connected with patient on 03/09/19 at 10:20 AM EST by telephone and verified that I am speaking with the correct person using two identifiers.   I discussed the limitations, risks, security and privacy concerns of performing an evaluation and management service by telephone and the availability of in person appointments. I also discussed with the patient that there may be a patient responsible charge related to this service. The patient expressed understanding and agreed to proceed.  The patient was at work I spoke with the patient from my  office  Courtney Wyatt is a 34 y.o. G3P1011 at [redacted]w[redacted]d being seen today for ongoing prenatal care.  She is currently monitored for the following issues for this low-risk pregnancy and has Anxiety; H/O suicide attempt; Chronic interstitial cystitis; History of cesarean delivery; and Encounter for supervision of low-risk pregnancy, antepartum on their problem list.  ----------------------------------------------------------------------------------- Patient reports occas nausea.   Denies pain, VB, leaking of fluid.  ----------------------------------------------------------------------------------- The following portions of the patient's history were reviewed and updated as appropriate: allergies, current medications, past family history, past medical history, past social history, past surgical history and problem list. Problem list updated.   Objective  Last menstrual period 09/18/2018, unknown if currently breastfeeding. Pregravid weight 155 lb (70.3 kg) Total Weight Gain 11 lb (4.99 kg)  Physical Exam could not be performed. Because of the COVID-19 outbreak this visit was performed over the phone and not in person.   Assessment   34 y.o. G3P1011 at [redacted]w[redacted]d by  06/25/2019, by Last Menstrual Period presenting for routine prenatal visit  Plan   Gestational age appropriate obstetric precautions including but not limited to  vaginal bleeding, contractions, leaking of fluid and fetal movement were reviewed in detail with the patient.    Glucola nv Desires VBAC if favorable at term  Follow Up Instructions: 4 weeks   I discussed the assessment and treatment plan with the patient. The patient was provided an opportunity to ask questions and all were answered. The patient agreed with the plan and demonstrated an understanding of the instructions.   The patient was advised to call back or seek an in-person evaluation if the symptoms worsen or if the condition fails to improve as anticipated.  I provided 8 minutes of non-face-to-face time during this encounter.  Return in about 4 weeks (around 04/06/2019) for ROB w glucola.  Annamarie Major, MD [facility], [facility] 03/09/2019 10:25 AM

## 2019-03-13 ENCOUNTER — Other Ambulatory Visit: Payer: Self-pay | Admitting: Obstetrics & Gynecology

## 2019-03-13 ENCOUNTER — Telehealth: Payer: Self-pay | Admitting: Obstetrics & Gynecology

## 2019-03-13 DIAGNOSIS — Z131 Encounter for screening for diabetes mellitus: Secondary | ICD-10-CM

## 2019-03-13 NOTE — Telephone Encounter (Signed)
Patient is schedule for 1 hour glucose for 04/03/19 and follow up with [facility]. Please place order for glucose. Thank you!

## 2019-03-30 ENCOUNTER — Other Ambulatory Visit: Payer: Self-pay

## 2019-03-30 ENCOUNTER — Other Ambulatory Visit: Payer: BC Managed Care – PPO

## 2019-03-30 DIAGNOSIS — Z131 Encounter for screening for diabetes mellitus: Secondary | ICD-10-CM | POA: Diagnosis not present

## 2019-03-31 LAB — 28 WEEK RH+PANEL
Basophils Absolute: 0 10*3/uL (ref 0.0–0.2)
Basos: 0 %
EOS (ABSOLUTE): 0.1 10*3/uL (ref 0.0–0.4)
Eos: 1 %
Gestational Diabetes Screen: 102 mg/dL (ref 65–139)
HIV Screen 4th Generation wRfx: NONREACTIVE
Hematocrit: 37.3 % (ref 34.0–46.6)
Hemoglobin: 12.6 g/dL (ref 11.1–15.9)
Immature Grans (Abs): 0.1 10*3/uL (ref 0.0–0.1)
Immature Granulocytes: 1 %
Lymphocytes Absolute: 1.7 10*3/uL (ref 0.7–3.1)
Lymphs: 19 %
MCH: 29.9 pg (ref 26.6–33.0)
MCHC: 33.8 g/dL (ref 31.5–35.7)
MCV: 88 fL (ref 79–97)
Monocytes Absolute: 0.6 10*3/uL (ref 0.1–0.9)
Monocytes: 6 %
Neutrophils Absolute: 6.6 10*3/uL (ref 1.4–7.0)
Neutrophils: 73 %
Platelets: 271 10*3/uL (ref 150–450)
RBC: 4.22 x10E6/uL (ref 3.77–5.28)
RDW: 12.1 % (ref 11.7–15.4)
RPR Ser Ql: NONREACTIVE
WBC: 9.1 10*3/uL (ref 3.4–10.8)

## 2019-04-03 ENCOUNTER — Encounter: Payer: Self-pay | Admitting: Obstetrics & Gynecology

## 2019-04-03 ENCOUNTER — Other Ambulatory Visit: Payer: BC Managed Care – PPO

## 2019-04-03 ENCOUNTER — Ambulatory Visit (INDEPENDENT_AMBULATORY_CARE_PROVIDER_SITE_OTHER): Payer: BC Managed Care – PPO | Admitting: Obstetrics & Gynecology

## 2019-04-03 ENCOUNTER — Other Ambulatory Visit: Payer: Self-pay

## 2019-04-03 VITALS — BP 100/60 | Wt 175.0 lb

## 2019-04-03 DIAGNOSIS — Z3493 Encounter for supervision of normal pregnancy, unspecified, third trimester: Secondary | ICD-10-CM

## 2019-04-03 DIAGNOSIS — Z3A28 28 weeks gestation of pregnancy: Secondary | ICD-10-CM

## 2019-04-03 DIAGNOSIS — Z98891 History of uterine scar from previous surgery: Secondary | ICD-10-CM

## 2019-04-03 NOTE — Patient Instructions (Signed)
Third Trimester of Pregnancy The third trimester is from week 28 through week 40 (months 7 through 9). The third trimester is a time when the unborn baby (fetus) is growing rapidly. At the end of the ninth month, the fetus is about 20 inches in length and weighs 6-10 pounds. Body changes during your third trimester Your body will continue to go through many changes during pregnancy. The changes vary from woman to woman. During the third trimester:  Your weight will continue to increase. You can expect to gain 25-35 pounds (11-16 kg) by the end of the pregnancy.  You may begin to get stretch marks on your hips, abdomen, and breasts.  You may urinate more often because the fetus is moving lower into your pelvis and pressing on your bladder.  You may develop or continue to have heartburn. This is caused by increased hormones that slow down muscles in the digestive tract.  You may develop or continue to have constipation because increased hormones slow digestion and cause the muscles that push waste through your intestines to relax.  You may develop hemorrhoids. These are swollen veins (varicose veins) in the rectum that can itch or be painful.  You may develop swollen, bulging veins (varicose veins) in your legs.  You may have increased body aches in the pelvis, back, or thighs. This is due to weight gain and increased hormones that are relaxing your joints.  You may have changes in your hair. These can include thickening of your hair, rapid growth, and changes in texture. Some women also have hair loss during or after pregnancy, or hair that feels dry or thin. Your hair will most likely return to normal after your baby is born.  Your breasts will continue to grow and they will continue to become tender. A yellow fluid (colostrum) may leak from your breasts. This is the first milk you are producing for your baby.  Your belly button may stick out.  You may notice more swelling in your hands,  face, or ankles.  You may have increased tingling or numbness in your hands, arms, and legs. The skin on your belly may also feel numb.  You may feel short of breath because of your expanding uterus.  You may have more problems sleeping. This can be caused by the size of your belly, increased need to urinate, and an increase in your body's metabolism.  You may notice the fetus "dropping," or moving lower in your abdomen (lightening).  You may have increased vaginal discharge.  You may notice your joints feel loose and you may have pain around your pelvic bone. What to expect at prenatal visits You will have prenatal exams every 2 weeks until week 36. Then you will have weekly prenatal exams. During a routine prenatal visit:  You will be weighed to make sure you and the baby are growing normally.  Your blood pressure will be taken.  Your abdomen will be measured to track your baby's growth.  The fetal heartbeat will be listened to.  Any test results from the previous visit will be discussed.  You may have a cervical check near your due date to see if your cervix has softened or thinned (effaced).  You will be tested for Group B streptococcus. This happens between 35 and 37 weeks. Your health care provider may ask you:  What your birth plan is.  How you are feeling.  If you are feeling the baby move.  If you have had any abnormal   symptoms, such as leaking fluid, bleeding, severe headaches, or abdominal cramping.  If you are using any tobacco products, including cigarettes, chewing tobacco, and electronic cigarettes.  If you have any questions. Other tests or screenings that may be performed during your third trimester include:  Blood tests that check for low iron levels (anemia).  Fetal testing to check the health, activity level, and growth of the fetus. Testing is done if you have certain medical conditions or if there are problems during the pregnancy.  Nonstress test  (NST). This test checks the health of your baby to make sure there are no signs of problems, such as the baby not getting enough oxygen. During this test, a belt is placed around your belly. The baby is made to move, and its heart rate is monitored during movement. What is false labor? False labor is a condition in which you feel small, irregular tightenings of the muscles in the womb (contractions) that usually go away with rest, changing position, or drinking water. These are called Braxton Hicks contractions. Contractions may last for hours, days, or even weeks before true labor sets in. If contractions come at regular intervals, become more frequent, increase in intensity, or become painful, you should see your health care provider. What are the signs of labor?  Abdominal cramps.  Regular contractions that start at 10 minutes apart and become stronger and more frequent with time.  Contractions that start on the top of the uterus and spread down to the lower abdomen and back.  Increased pelvic pressure and dull back pain.  A watery or bloody mucus discharge that comes from the vagina.  Leaking of amniotic fluid. This is also known as your "water breaking." It could be a slow trickle or a gush. Let your health care provider know if it has a color or strange odor. If you have any of these signs, call your health care provider right away, even if it is before your due date. Follow these instructions at home: Medicines  Follow your health care provider's instructions regarding medicine use. Specific medicines may be either safe or unsafe to take during pregnancy.  Take a prenatal vitamin that contains at least 600 micrograms (mcg) of folic acid.  If you develop constipation, try taking a stool softener if your health care provider approves. Eating and drinking   Eat a balanced diet that includes fresh fruits and vegetables, whole grains, good sources of protein such as meat, eggs, or tofu,  and low-fat dairy. Your health care provider will help you determine the amount of weight gain that is right for you.  Avoid raw meat and uncooked cheese. These carry germs that can cause birth defects in the baby.  If you have low calcium intake from food, talk to your health care provider about whether you should take a daily calcium supplement.  Eat four or five small meals rather than three large meals a day.  Limit foods that are high in fat and processed sugars, such as fried and sweet foods.  To prevent constipation: ? Drink enough fluid to keep your urine clear or pale yellow. ? Eat foods that are high in fiber, such as fresh fruits and vegetables, whole grains, and beans. Activity  Exercise only as directed by your health care provider. Most women can continue their usual exercise routine during pregnancy. Try to exercise for 30 minutes at least 5 days a week. Stop exercising if you experience uterine contractions.  Avoid heavy lifting.  Do   not exercise in extreme heat or humidity, or at high altitudes.  Wear low-heel, comfortable shoes.  Practice good posture.  You may continue to have sex unless your health care provider tells you otherwise. Relieving pain and discomfort  Take frequent breaks and rest with your legs elevated if you have leg cramps or low back pain.  Take warm sitz baths to soothe any pain or discomfort caused by hemorrhoids. Use hemorrhoid cream if your health care provider approves.  Wear a good support bra to prevent discomfort from breast tenderness.  If you develop varicose veins: ? Wear support pantyhose or compression stockings as told by your healthcare provider. ? Elevate your feet for 15 minutes, 3-4 times a day. Prenatal care  Write down your questions. Take them to your prenatal visits.  Keep all your prenatal visits as told by your health care provider. This is important. Safety  Wear your seat belt at all times when driving.  Make  a list of emergency phone numbers, including numbers for family, friends, the hospital, and police and fire departments. General instructions  Avoid cat litter boxes and soil used by cats. These carry germs that can cause birth defects in the baby. If you have a cat, ask someone to clean the litter box for you.  Do not travel far distances unless it is absolutely necessary and only with the approval of your health care provider.  Do not use hot tubs, steam rooms, or saunas.  Do not drink alcohol.  Do not use any products that contain nicotine or tobacco, such as cigarettes and e-cigarettes. If you need help quitting, ask your health care provider.  Do not use any medicinal herbs or unprescribed drugs. These chemicals affect the formation and growth of the baby.  Do not douche or use tampons or scented sanitary pads.  Do not cross your legs for long periods of time.  To prepare for the arrival of your baby: ? Take prenatal classes to understand, practice, and ask questions about labor and delivery. ? Make a trial run to the hospital. ? Visit the hospital and tour the maternity area. ? Arrange for maternity or paternity leave through employers. ? Arrange for family and friends to take care of pets while you are in the hospital. ? Purchase a rear-facing car seat and make sure you know how to install it in your car. ? Pack your hospital bag. ? Prepare the baby's nursery. Make sure to remove all pillows and stuffed animals from the baby's crib to prevent suffocation.  Visit your dentist if you have not gone during your pregnancy. Use a soft toothbrush to brush your teeth and be gentle when you floss. Contact a health care provider if:  You are unsure if you are in labor or if your water has broken.  You become dizzy.  You have mild pelvic cramps, pelvic pressure, or nagging pain in your abdominal area.  You have lower back pain.  You have persistent nausea, vomiting, or  diarrhea.  You have an unusual or bad smelling vaginal discharge.  You have pain when you urinate. Get help right away if:  Your water breaks before 37 weeks.  You have regular contractions less than 5 minutes apart before 37 weeks.  You have a fever.  You are leaking fluid from your vagina.  You have spotting or bleeding from your vagina.  You have severe abdominal pain or cramping.  You have rapid weight loss or weight gain.  You have   shortness of breath with chest pain.  You notice sudden or extreme swelling of your face, hands, ankles, feet, or legs.  Your baby makes fewer than 10 movements in 2 hours.  You have severe headaches that do not go away when you take medicine.  You have vision changes. Summary  The third trimester is from week 28 through week 40, months 7 through 9. The third trimester is a time when the unborn baby (fetus) is growing rapidly.  During the third trimester, your discomfort may increase as you and your baby continue to gain weight. You may have abdominal, leg, and back pain, sleeping problems, and an increased need to urinate.  During the third trimester your breasts will keep growing and they will continue to become tender. A yellow fluid (colostrum) may leak from your breasts. This is the first milk you are producing for your baby.  False labor is a condition in which you feel small, irregular tightenings of the muscles in the womb (contractions) that eventually go away. These are called Braxton Hicks contractions. Contractions may last for hours, days, or even weeks before true labor sets in.  Signs of labor can include: abdominal cramps; regular contractions that start at 10 minutes apart and become stronger and more frequent with time; watery or bloody mucus discharge that comes from the vagina; increased pelvic pressure and dull back pain; and leaking of amniotic fluid. This information is not intended to replace advice given to you by your  health care provider. Make sure you discuss any questions you have with your health care provider. Document Revised: 04/13/2018 Document Reviewed: 01/27/2016 Elsevier Patient Education  2020 [employer]  

## 2019-04-03 NOTE — Progress Notes (Signed)
  Subjective  Fetal Movement? yes Contractions? no Leaking Fluid? yes Vaginal Bleeding? no  Objective  BP 100/60   Wt 175 lb (79.4 kg)   LMP 09/18/2018 (Exact Date)   BMI 31.00 kg/m  General: NAD Pumonary: no increased work of breathing Abdomen: gravid, non-tender Extremities: no edema Psychiatric: mood appropriate, affect full  Assessment  35 y.o. G3P1011 at [redacted]w[redacted]d by  06/25/2019, by Last Menstrual Period presenting for routine prenatal visit  Plan   Problem List Items Addressed This Visit      Other   History of cesarean delivery   Encounter for supervision of low-risk pregnancy, antepartum    Other Visit Diagnoses    [redacted] weeks gestation of pregnancy    -  Primary     Clinic [facility] Prenatal Labs  Dating LMP confimred by 8 wk [place] Blood type: O/Positive/-- (11/10 1405)   Genetic Screen Declines Antibody:Negative (11/10 1405)  Anatomic [place] Nml Rubella: 4.36 (11/10 1405) Varicella: Imm  GTT Third trimester:  RPR: Non Reactive (11/10 1405)   Rhogam O+ HBsAg: Negative (11/10 1405)   TDaP vaccine      Flu Shot: 10/2018 HIV: Non Reactive (11/10 1405)   Baby Food  Breast GBS:   Contraception  Vasec or BTL (if CS) Pap:2018; due PP  CBB   No   CS/VBAC  VBAC desired Last pregnancy CS for FTP, 2019  Support Person Dylan, Husband PH to deliver   Glucola nml Breast feeding plans Contraception options discussed, prefers permanent by vasec or BTL  Annamarie Major, MD, [facility], [facility] 04/03/2019  3:06 PM

## 2019-04-17 ENCOUNTER — Encounter: Payer: Self-pay | Admitting: Obstetrics & Gynecology

## 2019-04-17 ENCOUNTER — Other Ambulatory Visit: Payer: Self-pay

## 2019-04-17 ENCOUNTER — Ambulatory Visit (INDEPENDENT_AMBULATORY_CARE_PROVIDER_SITE_OTHER): Payer: BC Managed Care – PPO | Admitting: Obstetrics & Gynecology

## 2019-04-17 VITALS — BP 120/80 | Wt 176.0 lb

## 2019-04-17 DIAGNOSIS — Z3493 Encounter for supervision of normal pregnancy, unspecified, third trimester: Secondary | ICD-10-CM

## 2019-04-17 DIAGNOSIS — Z3A30 30 weeks gestation of pregnancy: Secondary | ICD-10-CM

## 2019-04-17 DIAGNOSIS — Z98891 History of uterine scar from previous surgery: Secondary | ICD-10-CM

## 2019-04-17 DIAGNOSIS — Z23 Encounter for immunization: Secondary | ICD-10-CM

## 2019-04-17 MED ORDER — FLUCONAZOLE 150 MG PO TABS: 150.0000 mg | ORAL_TABLET | Freq: Once | ORAL | 3 refills | Status: AC

## 2019-04-17 NOTE — Addendum Note (Signed)
Addended by: Cornelius Moras D on: 04/17/2019 04:40 PM   Modules accepted: Orders

## 2019-04-17 NOTE — Patient Instructions (Signed)

## 2019-04-17 NOTE — Progress Notes (Signed)
  Subjective  Fetal Movement? yes Contractions? no Leaking Fluid? no Vaginal Bleeding? no  Objective  BP 120/80   Wt 176 lb (79.8 kg)   LMP 09/18/2018 (Exact Date)   BMI 31.18 kg/m  General: NAD Pumonary: no increased work of breathing Abdomen: gravid, non-tender Extremities: no edema Psychiatric: mood appropriate, affect full  Assessment  34 y.o. G3P1011 at [redacted]w[redacted]d by  06/25/2019, by Last Menstrual Period presenting for routine prenatal visit  Plan   Problem List Items Addressed This Visit      Other   History of cesarean delivery    Other Visit Diagnoses    [redacted] weeks gestation of pregnancy    -  Primary   Encounter for supervision of low-risk pregnancy in third trimester        Dlfucan for yeast infection VBAC and CS options discussed today  Annamarie Major, MD, [facility], [facility] 04/17/2019  4:24 PM

## 2019-05-02 ENCOUNTER — Other Ambulatory Visit: Payer: Self-pay

## 2019-05-02 ENCOUNTER — Encounter: Payer: Self-pay | Admitting: Obstetrics & Gynecology

## 2019-05-02 ENCOUNTER — Ambulatory Visit (INDEPENDENT_AMBULATORY_CARE_PROVIDER_SITE_OTHER): Payer: BC Managed Care – PPO | Admitting: Obstetrics & Gynecology

## 2019-05-02 VITALS — BP 120/80 | Wt 179.0 lb

## 2019-05-02 DIAGNOSIS — Z3A32 32 weeks gestation of pregnancy: Secondary | ICD-10-CM

## 2019-05-02 DIAGNOSIS — Z3493 Encounter for supervision of normal pregnancy, unspecified, third trimester: Secondary | ICD-10-CM

## 2019-05-02 DIAGNOSIS — Z3490 Encounter for supervision of normal pregnancy, unspecified, unspecified trimester: Secondary | ICD-10-CM

## 2019-05-02 DIAGNOSIS — Z98891 History of uterine scar from previous surgery: Secondary | ICD-10-CM

## 2019-05-02 NOTE — Progress Notes (Signed)
  Subjective  Fetal Movement? yes Contractions? no Leaking Fluid? no Vaginal Bleeding? no Occas BHs  Objective  BP 120/80   Wt 179 lb (81.2 kg)   LMP 09/18/2018 (Exact Date)   BMI 31.71 kg/m  General: NAD Pumonary: no increased work of breathing Abdomen: gravid, non-tender Extremities: no edema Psychiatric: mood appropriate, affect full  Assessment  34 y.o. G3P1011 at [redacted]w[redacted]d by  06/25/2019, by Last Menstrual Period presenting for routine prenatal visit  Plan   Problem List Items Addressed This Visit      Other   History of cesarean delivery   Encounter for supervision of low-risk pregnancy, antepartum    Other Visit Diagnoses    [redacted] weeks gestation of pregnancy    -  Primary   Encounter for supervision of low-risk pregnancy in third trimester        VBAC planned CS scheduled for 6/17 if not delivered by then TDaP UTD  Annamarie Major, MD, [facility], [facility] 05/02/2019  4:26 PM

## 2019-05-16 ENCOUNTER — Ambulatory Visit (INDEPENDENT_AMBULATORY_CARE_PROVIDER_SITE_OTHER): Payer: BC Managed Care – PPO | Admitting: Obstetrics & Gynecology

## 2019-05-16 ENCOUNTER — Other Ambulatory Visit: Payer: Self-pay

## 2019-05-16 ENCOUNTER — Encounter: Payer: Self-pay | Admitting: Obstetrics & Gynecology

## 2019-05-16 DIAGNOSIS — Z98891 History of uterine scar from previous surgery: Secondary | ICD-10-CM

## 2019-05-16 DIAGNOSIS — Z3493 Encounter for supervision of normal pregnancy, unspecified, third trimester: Secondary | ICD-10-CM

## 2019-05-16 DIAGNOSIS — Z3A34 34 weeks gestation of pregnancy: Secondary | ICD-10-CM

## 2019-05-16 NOTE — Progress Notes (Signed)
Virtual Visit via Telephone Note  I connected with patient on 05/16/19 at  4:10 PM EDT by telephone and verified that I am speaking with the correct person using two identifiers.   I discussed the limitations, risks, security and privacy concerns of performing an evaluation and management service by telephone and the availability of in person appointments. I also discussed with the patient that there may be a patient responsible charge related to this service. The patient expressed understanding and agreed to proceed.  The patient was at Home I spoke with the patient from my  Office  Courtney Wyatt is a 34 y.o. G3P1011 at [redacted]w[redacted]d being seen today for ongoing prenatal care.  She is currently monitored for the following issues for this low-risk pregnancy and has Anxiety; H/O suicide attempt; Chronic interstitial cystitis; History of cesarean delivery; and Encounter for supervision of low-risk pregnancy, antepartum on their problem list.  ----------------------------------------------------------------------------------- Patient reports recent cold like sx's w sore throat and congestion, no fever.  Good FM.  No ctxs or VB.Marland Kitchen   Denies pain, VB, leaking of fluid.  ----------------------------------------------------------------------------------- The following portions of the patient's history were reviewed and updated as appropriate: allergies, current medications, past family history, past medical history, past social history, past surgical history and problem list. Problem list updated.   Objective  Last menstrual period 09/18/2018, unknown if currently breastfeeding. Pregravid weight 155 lb (70.3 kg) Total Weight Gain 24 lb (10.9 kg)  Physical Exam could not be performed. Because of the COVID-19 outbreak this visit was performed over the phone and not in person.   Assessment   34 y.o. G3P1011 at [redacted]w[redacted]d by  06/25/2019, by Last Menstrual Period presenting for routine prenatal visit  Plan      ICD-10-CM   1. [redacted] weeks gestation of pregnancy  Z3A.34   2. History of cesarean delivery  Z98.891   3. Encounter for supervision of low-risk pregnancy in third trimester  Z65.93    Gestational age appropriate obstetric precautions including but not limited to vaginal bleeding, contractions, leaking of fluid and fetal movement were reviewed in detail with the patient.     Follow Up Instructions: 2 weeks   I discussed the assessment and treatment plan with the patient. The patient was provided an opportunity to ask questions and all were answered. The patient agreed with the plan and demonstrated an understanding of the instructions.   The patient was advised to call back or seek an in-person evaluation if the symptoms worsen or if the condition fails to improve as anticipated.  I provided 15 minutes of non-face-to-face time during this encounter.  Return in about 2 weeks (around 05/30/2019) for ROB.  Annamarie Major, MD [facility], [facility] 05/16/2019 4:31 PM

## 2019-05-31 ENCOUNTER — Ambulatory Visit (INDEPENDENT_AMBULATORY_CARE_PROVIDER_SITE_OTHER): Payer: BC Managed Care – PPO | Admitting: Obstetrics & Gynecology

## 2019-05-31 ENCOUNTER — Other Ambulatory Visit: Payer: Self-pay

## 2019-05-31 ENCOUNTER — Encounter: Payer: Self-pay | Admitting: Obstetrics & Gynecology

## 2019-05-31 VITALS — BP 100/60 | Wt 185.0 lb

## 2019-05-31 DIAGNOSIS — Z3A36 36 weeks gestation of pregnancy: Secondary | ICD-10-CM

## 2019-05-31 DIAGNOSIS — Z98891 History of uterine scar from previous surgery: Secondary | ICD-10-CM

## 2019-05-31 DIAGNOSIS — Z3493 Encounter for supervision of normal pregnancy, unspecified, third trimester: Secondary | ICD-10-CM

## 2019-05-31 DIAGNOSIS — Z3685 Encounter for antenatal screening for Streptococcus B: Secondary | ICD-10-CM

## 2019-05-31 LAB — POCT URINALYSIS DIPSTICK OB
Glucose, UA: NEGATIVE
POC,PROTEIN,UA: NEGATIVE

## 2019-05-31 NOTE — Progress Notes (Signed)
  Subjective  Fetal Movement? yes Contractions? Yes- BHs Leaking Fluid? no Vaginal Bleeding? no  Objective  BP 100/60   Wt 185 lb (83.9 kg)   LMP 09/18/2018 (Exact Date)   BMI 32.77 kg/m  General: NAD Pumonary: no increased work of breathing Abdomen: gravid, non-tender Extremities: no edema Psychiatric: mood appropriate, affect full  Assessment  34 y.o. G3P1011 at [redacted]w[redacted]d by  06/25/2019, by Last Menstrual Period presenting for routine prenatal visit  Plan   Problem List Items Addressed This Visit      Other   History of cesarean delivery    Other Visit Diagnoses    [redacted] weeks gestation of pregnancy    -  Primary   Relevant Orders   POC Urinalysis Dipstick OB   Encounter for supervision of low-risk pregnancy in third trimester       Antenatal screening for streptococcus B       Relevant Orders   Culture, beta strep (group b only)    PNV, FMC, GBS today Labor precautions discussed CS Jun 17 if undelivered   Annamarie Major, MD, [facility], [facility] 05/31/2019  4:50 PM

## 2019-05-31 NOTE — Patient Instructions (Signed)
Cesarean Delivery Cesarean birth, or cesarean delivery, is the surgical delivery of a baby through an incision in the abdomen and the uterus. This may be referred to as a C-section. This procedure may be scheduled ahead of time, or it may be done in an emergency situation. Tell a health care provider about:  Any allergies you have.  All medicines you are taking, including vitamins, herbs, eye drops, creams, and over-the-counter medicines.  Any problems you or family members have had with anesthetic medicines.  Any blood disorders you have.  Any surgeries you have had.  Any medical conditions you have.  Whether you or any members of your family have a history of deep vein thrombosis (DVT) or pulmonary embolism (PE). What are the risks? Generally, this is a safe procedure. However, problems may occur, including:  Infection.  Bleeding.  Allergic reactions to medicines.  Damage to other structures or organs.  Blood clots.  Injury to your baby. What happens before the procedure? General instructions  Follow instructions from your health care provider about eating or drinking restrictions.  If you know that you are going to have a cesarean delivery, do not shave your pubic area. Shaving before the procedure may increase your risk of infection.  Plan to have someone take you home from the hospital.  Ask your health care provider what steps will be taken to prevent infection. These may include: ? Removing hair at the surgery site. ? Washing skin with a germ-killing soap. ? Taking antibiotic medicine.  Depending on the reason for your cesarean delivery, you may have a physical exam or additional testing, such as an ultrasound.  You may have your blood or urine tested. Questions for your health care provider  Ask your health care provider about: ? Changing or stopping your regular medicines. This is especially important if you are taking diabetes medicines or blood  thinners. ? Your pain management plan. This is especially important if you plan to breastfeed your baby. ? How long you will be in the hospital after the procedure. ? Any concerns you may have about receiving blood products, if you need them during the procedure. ? Cord blood banking, if you plan to collect your baby's umbilical cord blood.  You may also want to ask your health care provider: ? Whether you will be able to hold or breastfeed your baby while you are still in the operating room. ? Whether your baby can stay with you immediately after the procedure and during your recovery. ? Whether a family member or a person of your choice can go with you into the operating room and stay with you during the procedure, immediately after the procedure, and during your recovery. What happens during the procedure?   An IV will be inserted into one of your veins.  Fluid and medicines, such as antibiotics, will be given before the surgery.  Fetal monitors will be placed on your abdomen to check your baby's heart rate.  You may be given a special warming gown to wear to keep your temperature stable.  A catheter may be inserted into your bladder through your urethra. This drains your urine during the procedure.  You may be given one or more of the following: ? A medicine to numb the area (local anesthetic). ? A medicine to make you fall asleep (general anesthetic). ? A medicine (regional anesthetic) that is injected into your back or through a small thin tube placed in your back (spinal anesthetic or epidural anesthetic).   This numbs everything below the injection site and allows you to stay awake during your procedure. If this makes you feel nauseous, tell your health care provider. Medicines will be available to help reduce any nausea you may feel.  An incision will be made in your abdomen, and then in your uterus.  If you are awake during your procedure, you may feel tugging and pulling in  your abdomen, but you should not feel pain. If you feel pain, tell your health care provider immediately.  Your baby will be removed from your uterus. You may feel more pressure or pushing while this happens.  Immediately after birth, your baby will be dried and kept warm. You may be able to hold and breastfeed your baby.  The umbilical cord may be clamped and cut during this time. This usually occurs after waiting a period of 1-2 minutes after delivery.  Your placenta will be removed from your uterus.  Your incisions will be closed with stitches (sutures). Staples, skin glue, or adhesive strips may also be applied to the incision in your abdomen.  Bandages (dressings) may be placed over the incision in your abdomen. The procedure may vary among health care providers and hospitals. What happens after the procedure?  Your blood pressure, heart rate, breathing rate, and blood oxygen level will be monitored until you are discharged from the hospital.  You may continue to receive fluids and medicines through an IV.  You will have some pain. Medicines will be available to help control your pain.  To help prevent blood clots: ? You may be given medicines. ? You may have to wear compression stockings or devices. ? You will be encouraged to walk around when you are able.  Hospital staff will encourage and support bonding with your baby. Your hospital may have you and your baby to stay in the same room (rooming in) during your hospital stay to encourage successful bonding and breastfeeding.  You may be encouraged to cough and breathe deeply often. This helps to prevent lung problems.  If you have a catheter draining your urine, it will be removed as soon as possible after your procedure. Summary  Cesarean birth, or cesarean delivery, is the surgical delivery of a baby through an incision in the abdomen and the uterus.  Follow instructions from your health care provider about eating or  drinking restrictions before the procedure.  You will have some pain after the procedure. Medicines will be available to help control your pain.  Hospital staff will encourage and support bonding with your baby after the procedure. Your hospital may have you and your baby to stay in the same room (rooming in) during your hospital stay to encourage successful bonding and breastfeeding. This information is not intended to replace advice given to you by your health care provider. Make sure you discuss any questions you have with your health care provider. Document Revised: 06/27/2017 Document Reviewed: 06/27/2017 Elsevier Patient Education  2020 [employer]  

## 2019-06-01 DIAGNOSIS — Z3685 Encounter for antenatal screening for Streptococcus B: Secondary | ICD-10-CM | POA: Diagnosis not present

## 2019-06-05 LAB — CULTURE, BETA STREP (GROUP B ONLY): Strep Gp B Culture: NEGATIVE

## 2019-06-08 ENCOUNTER — Ambulatory Visit (INDEPENDENT_AMBULATORY_CARE_PROVIDER_SITE_OTHER): Payer: BC Managed Care – PPO | Admitting: Obstetrics & Gynecology

## 2019-06-08 ENCOUNTER — Other Ambulatory Visit: Payer: Self-pay

## 2019-06-08 ENCOUNTER — Encounter: Payer: Self-pay | Admitting: Obstetrics & Gynecology

## 2019-06-08 VITALS — BP 100/60 | Wt 185.0 lb

## 2019-06-08 DIAGNOSIS — Z98891 History of uterine scar from previous surgery: Secondary | ICD-10-CM

## 2019-06-08 DIAGNOSIS — O0993 Supervision of high risk pregnancy, unspecified, third trimester: Secondary | ICD-10-CM

## 2019-06-08 DIAGNOSIS — Z3A37 37 weeks gestation of pregnancy: Secondary | ICD-10-CM

## 2019-06-08 NOTE — Progress Notes (Signed)
PRE-OPERATIVE HISTORY AND PHYSICAL EXAM  HPI:  Courtney Wyatt is a 34 y.o. G3P1011.  Patient's last menstrual period was 09/18/2018 (exact date).  [redacted]w[redacted]d Estimated Date of Delivery: 06/25/19  She is being admitted for Elective repeat.  She has been waiting to attempt VBAC, \and has interest in TOLAC if she has spontaneous labor.  However, if she has not by June 17, she desires a repeat cesarean section.  She has also expressed desire for permanent sterility.  She has had no other complications this pregnancy.  PMHx: She  has a past medical history of ADD (attention deficit disorder), Anxiety, Bacterial vaginosis, Herpes, genital (02/2008), History of Papanicolaou smear of cervix (07/10/2013; 10/21/2014), Hyperhidrosis (04/18/2013), Interstitial cystitis, PONV (postoperative nausea and vomiting), and SAB (spontaneous abortion) (07/14/2016). Also,  has a past surgical history that includes laparoscopy (04/2004); Tonsillectomy (1994); Wisdom tooth extraction; and Cesarean section (N/A, 08/25/2017)., family history includes Breast cancer (age of onset: 81) in her maternal aunt; Depression in her mother; Diabetes in her father; Hyperlipidemia in her mother; Hypertension in her father and mother; Lung cancer in her maternal grandmother.,  reports that she quit smoking about 3 years ago. She has never used smokeless tobacco. She reports current alcohol use. She reports that she does not use drugs. OB History  Gravida Para Term Preterm AB Living  3 1 1  0 1 1  SAB TAB Ectopic Multiple Live Births  1 0 0 0 1    # Outcome Date GA Lbr Len/2nd Weight Sex Delivery Anes PTL Lv  3 Current           2 Term 08/25/17 [redacted]w[redacted]d  8 lb 9.4 oz (3.895 kg) M CS-LTranv EPI  LIV  1 SAB           Patient denies any other pertinent gynecologic issues. See prenatal record for more complete H&P   Current Outpatient Medications:  .  Doxylamine-Pyridoxine (DICLEGIS) 10-10 MG TBEC, Take 2 tablets by mouth at bedtime. If symptoms  persist, add one tablet in the morning and one in the afternoon, Disp: 100 tablet, Rfl: 5 .  Prenatal Vit-Fe Fumarate-FA (MULTIVITAMIN-PRENATAL) 27-0.8 MG TABS tablet, Take 1 tablet by mouth at bedtime. , Disp: , Rfl:  .  sertraline (ZOLOFT) 50 MG tablet, TAKE ONE TABLET EVERY DAY (Patient taking differently: Take 50 mg by mouth daily. TAKE ONE TABLET EVERY DAY), Disp: 90 tablet, Rfl: 0 Also, has No Known Allergies.  Review of Systems  Constitutional: Positive for malaise/fatigue. Negative for chills and fever.  HENT: Negative for congestion, sinus pain and sore throat.   Eyes: Negative for blurred vision and pain.  Respiratory: Negative for cough and wheezing.   Cardiovascular: Negative for chest pain and leg swelling.  Gastrointestinal: Negative for abdominal pain, constipation, diarrhea, heartburn, nausea and vomiting.  Genitourinary: Negative for dysuria, frequency, hematuria and urgency.  Musculoskeletal: Negative for back pain, joint pain, myalgias and neck pain.  Skin: Negative for itching and rash.  Neurological: Negative for dizziness, tremors and weakness.  Endo/Heme/Allergies: Does not bruise/bleed easily.  Psychiatric/Behavioral: Negative for depression. The patient is not nervous/anxious and does not have insomnia.     Objective: LMP 09/18/2018 (Exact Date)  There were no vitals filed for this visit. Physical Exam Constitutional:      General: She is not in acute distress.    Appearance: She is well-developed.  HENT:     Head: Normocephalic and atraumatic. No laceration.     Right Ear: Hearing  normal.     Left Ear: Hearing normal.     Mouth/Throat:     Pharynx: Uvula midline.  Eyes:     Pupils: Pupils are equal, round, and reactive to light.  Neck:     Thyroid: No thyromegaly.  Cardiovascular:     Rate and Rhythm: Normal rate and regular rhythm.     Heart sounds: No murmur. No friction rub. No gallop.   Pulmonary:     Effort: Pulmonary effort is normal. No  respiratory distress.     Breath sounds: Normal breath sounds. No wheezing.  Chest:     Breasts:        Right: No mass, skin change or tenderness.        Left: No mass, skin change or tenderness.  Abdominal:     General: Bowel sounds are normal. There is no distension.     Palpations: Abdomen is soft.     Tenderness: There is no abdominal tenderness. There is no rebound.  Musculoskeletal:        General: Normal range of motion.     Cervical back: Normal range of motion and neck supple.  Neurological:     Mental Status: She is alert and oriented to person, place, and time.     Cranial Nerves: No cranial nerve deficit.  Skin:    General: Skin is warm and dry.  Psychiatric:        Judgment: Judgment normal.  Vitals reviewed.   FHT 150s  Assessment: 1. [redacted] weeks gestation of pregnancy   2. History of cesarean delivery   3. Supervision of high risk pregnancy in third trimester     PLAN: 1.  Cesarean Delivery if no spontaneous labor by June 17.  Tubal planned.  Patient will undergo surgical management with Cesarean Section.   The risks of surgery were discussed in detail with the patient including but not limited to: bleeding which may require transfusion or reoperation; infection which may require antibiotics; injury to surrounding organs which may involve bowel, bladder, ureters ; need for additional procedures including laparoscopy or laparotomy; thromboembolic phenomenon, surgical site problems and other postoperative/anesthesia complications. Likelihood of success in alleviating the patient's condition was discussed. Routine postoperative instructions will be reviewed with the patient and her family in detail after surgery.  The patient concurred with the proposed plan, giving informed written consent for the surgery.  Patient will be NPO procedure.  Preoperative prophylactic antibiotics, as necessary, and SCDs ordered on call to the OR.  The patient has been fully informed about all  methods of contraception, both temporary and permanent. She understands that tubal ligation is meant to be permanent, absolute and irreversible. She was told that there is an approximately 1 in 400 chance of a pregnancy in the future after tubal ligation. She was told the short and long term complications of tubal ligation. She understands the risks from this surgery include, but are not limited to, the risks of anesthesia, hemorrhage, infection, perforation, and injury to adjacent structures, bowel, bladder and blood vessels.    [facility]inic [facility] Prenatal Labs  Dating LMP confimred by 8 wk [place] Blood type: O/Positive/-- (11/10 1405)   Genetic Screen Declines Antibody:Negative (11/10 1405)  Anatomic [place] Nml Rubella: 4.36 (11/10 1405) Varicella: Imm  GTT Third trimester:  RPR: Non Reactive (03/26 1009)   Rhogam O+ HBsAg: Negative (11/10 1405)   TDaP vaccine   04/2019   Flu Shot: 10/2018 HIV: Non Reactive (03/26 1009)   Baby Food  Breast ZYS:AYTKZSWF/-- (05/28 1638)  Contraception  Vasec or BTL (if CS) Pap:2018; due PP  CBB   No   CS/VBAC  VBAC desired Last pregnancy CS for FTP, 2019  Support Person [place], Husband    Annamarie Major, M.D. 06/08/2019 7:57 AM

## 2019-06-08 NOTE — Patient Instructions (Signed)
Cesarean Delivery, Care After This sheet gives you information about how to care for yourself after your procedure. Your health care provider may also give you more specific instructions. If you have problems or questions, contact your health care provider. What can I expect after the procedure? After the procedure, it is common to have:  A small amount of blood or clear fluid coming from the incision.  Some redness, swelling, and pain in your incision area.  Some abdominal pain and soreness.  Vaginal bleeding (lochia). Even though you did not have a vaginal delivery, you will still have vaginal bleeding and discharge.  Pelvic cramps.  Fatigue. You may have pain, swelling, and discomfort in the tissue between your vagina and your anus (perineum) if:  Your C-section was unplanned, and you were allowed to labor and push.  An incision was made in the area (episiotomy) or the tissue tore during attempted vaginal delivery. Follow these instructions at home: Incision care   Follow instructions from your health care provider about how to take care of your incision. Make sure you: ? Wash your hands with soap and water before you change your bandage (dressing). If soap and water are not available, use hand sanitizer. ? If you have a dressing, change it or remove it as told by your health care provider. ? Leave stitches (sutures), skin staples, skin glue, or adhesive strips in place. These skin closures may need to stay in place for 2 weeks or longer. If adhesive strip edges start to loosen and curl up, you may trim the loose edges. Do not remove adhesive strips completely unless your health care provider tells you to do that.  Check your incision area every day for signs of infection. Check for: ? More redness, swelling, or pain. ? More fluid or blood. ? Warmth. ? Pus or a bad smell.  Do not take baths, swim, or use a hot tub until your health care provider says it's okay. Ask your health  care provider if you can take showers.  When you cough or sneeze, hug a pillow. This helps with pain and decreases the chance of your incision opening up (dehiscing). Do this until your incision heals. Medicines  Take over-the-counter and prescription medicines only as told by your health care provider.  If you were prescribed an antibiotic medicine, take it as told by your health care provider. Do not stop taking the antibiotic even if you start to feel better.  Do not drive or use heavy machinery while taking prescription pain medicine. Lifestyle  Do not drink alcohol. This is especially important if you are breastfeeding or taking pain medicine.  Do not use any products that contain nicotine or tobacco, such as cigarettes, e-cigarettes, and chewing tobacco. If you need help quitting, ask your health care provider. Eating and drinking  Drink at least 8 eight-ounce glasses of water every day unless told not to by your health care provider. If you breastfeed, you may need to drink even more water.  Eat high-fiber foods every day. These foods may help prevent or relieve constipation. High-fiber foods include: ? Whole grain cereals and breads. ? Brown rice. ? Beans. ? Fresh fruits and vegetables. Activity   If possible, have someone help you care for your baby and help with household activities for at least a few days after you leave the hospital.  Return to your normal activities as told by your health care provider. Ask your health care provider what activities are safe for   you.  Rest as much as possible. Try to rest or take a nap while your baby is sleeping.  Do not lift anything that is heavier than 10 lbs (4.5 kg), or the limit that you were told, until your health care provider says that it is safe.  Talk with your health care provider about when you can engage in sexual activity. This may depend on your: ? Risk of infection. ? How fast you heal. ? Comfort and desire to  engage in sexual activity. General instructions  Do not use tampons or douches until your health care provider approves.  Wear loose, comfortable clothing and a supportive and well-fitting bra.  Keep your perineum clean and dry. Wipe from front to back when you use the toilet.  If you pass a blood clot, save it and call your health care provider to discuss. Do not flush blood clots down the toilet before you get instructions from your health care provider.  Keep all follow-up visits for you and your baby as told by your health care provider. This is important. Contact a health care provider if:  You have: ? A fever. ? Bad-smelling vaginal discharge. ? Pus or a bad smell coming from your incision. ? Difficulty or pain when urinating. ? A sudden increase or decrease in the frequency of your bowel movements. ? More redness, swelling, or pain around your incision. ? More fluid or blood coming from your incision. ? A rash. ? Nausea. ? Little or no interest in activities you used to enjoy. ? Questions about caring for yourself or your baby.  Your incision feels warm to the touch.  Your breasts turn red or become painful or hard.  You feel unusually sad or worried.  You vomit.  You pass a blood clot from your vagina.  You urinate more than usual.  You are dizzy or light-headed. Get help right away if:  You have: ? Pain that does not go away or get better with medicine. ? Chest pain. ? Difficulty breathing. ? Blurred vision or spots in your vision. ? Thoughts about hurting yourself or your baby. ? New pain in your abdomen or in one of your legs. ? A severe headache.  You faint.  You bleed from your vagina so much that you fill more than one sanitary pad in one hour. Bleeding should not be heavier than your heaviest period. Summary  After the procedure, it is common to have pain at your incision site, abdominal cramping, and slight bleeding from your vagina.  Check  your incision area every day for signs of infection.  Tell your health care provider about any unusual symptoms.  Keep all follow-up visits for you and your baby as told by your health care provider. This information is not intended to replace advice given to you by your health care provider. Make sure you discuss any questions you have with your health care provider. Document Revised: 06/29/2017 Document Reviewed: 06/29/2017 Elsevier Patient Education  2020 [employer] Inc.  

## 2019-06-11 ENCOUNTER — Other Ambulatory Visit: Payer: Self-pay

## 2019-06-11 ENCOUNTER — Encounter
Admission: RE | Admit: 2019-06-11 | Discharge: 2019-06-11 | Disposition: A | Discharge status: Home / Self Care | Payer: BC Managed Care – PPO | Source: Ambulatory Visit | Attending: Obstetrics & Gynecology | Admitting: Obstetrics & Gynecology

## 2019-06-11 ENCOUNTER — Other Ambulatory Visit: Payer: Self-pay | Admitting: Obstetrics & Gynecology

## 2019-06-11 DIAGNOSIS — Z01812 Encounter for preprocedural laboratory examination: Secondary | ICD-10-CM | POA: Diagnosis not present

## 2019-06-11 HISTORY — DX: Depression, unspecified: F32.A

## 2019-06-11 NOTE — Patient Instructions (Addendum)
Your procedure is scheduled on: Thurs 6/17 Report to [facility] at 5:15 and call [phone] to be escorted to L&D  Remember: Instructions that are not followed completely may result in serious medical risk,  up to and including death, or upon the discretion of your surgeon and anesthesiologist your  surgery may need to be rescheduled.     _X__ 1. Do not eat food after midnight the night before your procedure.                 No gum chewing or hard candies. You may drink clear liquids up to 2 hours                 before you are scheduled to arrive for your surgery- DO not drink clear                 liquids within 2 hours of the start of your surgery.                 Clear Liquids include:  water, apple juice without pulp, clear Gatorade, G2 or                  Gatorade Zero (avoid Red/Purple/Blue), Black Coffee or Tea (Do not add                 anything to coffee or tea). _____2.   Complete the carbohydrate drink provided to you, 2 hours before arrival.  __X__2.  On the morning of surgery brush your teeth with toothpaste and water, you                may rinse your mouth with mouthwash if you wish.  Do not swallow any toothpaste of mouthwash.     _X__ 3.  No Alcohol for 24 hours before or after surgery.   _X__ 4.  Do Not Smoke or use e-cigarettes For 24 Hours Prior to Your Surgery.                 Do not use any chewable tobacco products for at least 6 hours prior to                 Surgery.  _X__  5.  Do not use any recreational drugs (marijuana, cocaine, heroin, ecstacy, MDMA or other)                For at least one week prior to your surgery.  Combination of these drugs with anesthesia                May have life threatening results.  ____  6.  Bring all medications with you on the day of surgery if instructed.   _x___  7.  Notify your doctor if there is any change in your medical condition      (cold, fever, infections).     Do not wear jewelry,  make-up, hairpins, clips or nail polish. Do not wear lotions, powders, or perfumes. You may wear deodorant. Do not shave 48 hours prior to surgery. Men may shave face and neck. Do not bring valuables to the hospital.    [facility] is not responsible for any belongings or valuables.  Contacts, dentures or bridgework may not be worn into surgery. Leave your suitcase in the car. After surgery it may be brought to your room. For patients admitted to the hospital, discharge time is determined by your treatment team.   Patients discharged the day  of surgery will not be allowed to drive home.   Make arrangements for someone to be with you for the first 24 hours of your Same Day Discharge.  See visitor policy included   Please read over the following fact sheets that you were given:    _x___ Take these medicines the morning of surgery with A SIP OF WATER:    1. none  2.   3.   4.  5.  6.  ____ Fleet Enema (as directed)   __x__ Use CHG Soap (or wipes) as directed  ____ Use Benzoyl Peroxide Gel as instructed  ____ Use inhalers on the day of surgery  ____ Stop metformin 2 days prior to surgery    ____ Take 1/2 of usual insulin dose the night before surgery. No insulin the morning          of surgery.   ____ Stop Coumadin/Plavix/aspirin on   ____ Stop Anti-inflammatories on    ____ Stop supplements until after surgery.    ____ Bring C-Pap to the hospital.

## 2019-06-15 ENCOUNTER — Ambulatory Visit (INDEPENDENT_AMBULATORY_CARE_PROVIDER_SITE_OTHER): Payer: BC Managed Care – PPO | Admitting: Obstetrics & Gynecology

## 2019-06-15 ENCOUNTER — Other Ambulatory Visit: Payer: Self-pay

## 2019-06-15 ENCOUNTER — Encounter: Payer: Self-pay | Admitting: Obstetrics & Gynecology

## 2019-06-15 VITALS — BP 100/60 | Wt 187.0 lb

## 2019-06-15 DIAGNOSIS — Z3A38 38 weeks gestation of pregnancy: Secondary | ICD-10-CM

## 2019-06-15 DIAGNOSIS — O0993 Supervision of high risk pregnancy, unspecified, third trimester: Secondary | ICD-10-CM

## 2019-06-15 DIAGNOSIS — Z98891 History of uterine scar from previous surgery: Secondary | ICD-10-CM

## 2019-06-15 DIAGNOSIS — Z3493 Encounter for supervision of normal pregnancy, unspecified, third trimester: Secondary | ICD-10-CM

## 2019-06-15 NOTE — Patient Instructions (Signed)
PRE ADMISSION TESTING For Covid, prior to procedure Tuesday 9:00-10:00 [facility] entrance (drive up)  Results in 48-72 hours You will not receive notification if test results are negative. If positive for Covid19, your provider will notify you by phone, with additional instructions.  

## 2019-06-15 NOTE — Progress Notes (Signed)
  Subjective  Fetal Movement? yes Contractions? Two episodes of contractions for several hours on Sat and Thurs Leaking Fluid? no Vaginal Bleeding? no  Objective  BP 100/60   Wt 187 lb (84.8 kg)   LMP 09/18/2018 (Exact Date)   BMI 33.13 kg/m  General: NAD Pumonary: no increased work of breathing Abdomen: gravid, non-tender Extremities: no edema Psychiatric: mood appropriate, affect full Cx: 1-2/70/-3, VTX Membranes stripped Assessment  34 y.o. G3P1011 at [redacted]w[redacted]d by  06/25/2019, by Last Menstrual Period presenting for routine prenatal visit  Plan   Problem List Items Addressed This Visit      Other   Pregnancy - Primary   History of cesarean delivery    Other Visit Diagnoses    Supervision of high risk pregnancy in third trimester       Encounter for supervision of low-risk pregnancy in third trimester          Pregnancy #3 Problems (from 11/14/18 to present)    Problem Noted Resolved   Supervision of high risk pregnancy, antepartum 11/14/2018 by Nadara Mustard, MD No   Overview Addendum 06/08/2019  8:00 AM by Nadara Mustard, MD    [facility] Prenatal Labs  Dating LMP confimred by 8 wk [place] Blood type: O/Positive/-- (11/10 1405)   Genetic Screen Declines Antibody:Negative (11/10 1405)  Anatomic [place] Nml Rubella: 4.36 (11/10 1405) Varicella: Imm  GTT Third trimester:  RPR: Non Reactive (03/26 1009)   Rhogam O+ HBsAg: Negative (11/10 1405)   TDaP vaccine   04/2019   Flu Shot: 10/2018 HIV: Non Reactive (03/26 1009)   Baby Food  Breast UKG:URKYHCWC/-- (05/28 1638)  Contraception  Vasec or BTL (if CS) Pap:2018; due PP  CBB   No   CS/VBAC  VBAC desired Last pregnancy CS for FTP, 2019  Support Person Dylan, Husband PH to deliver          Previous Version     Recheck next week  Desires VBAC if possible.  May IOL w Pitocin instead of R CS on 6/16 or 17  If favorable  Membranes stripped today  Monitor for s/sx labor   Courtney Major, MD, [facility], [facility] 06/15/2019  4:07 PM

## 2019-06-19 ENCOUNTER — Encounter: Payer: Self-pay | Admitting: Anesthesiology

## 2019-06-19 ENCOUNTER — Other Ambulatory Visit
Admission: RE | Admit: 2019-06-19 | Discharge: 2019-06-19 | Disposition: A | Discharge status: Home / Self Care | Payer: BC Managed Care – PPO | Source: Ambulatory Visit | Attending: Obstetrics & Gynecology | Admitting: Obstetrics & Gynecology

## 2019-06-19 ENCOUNTER — Encounter: Payer: Self-pay | Admitting: Obstetrics & Gynecology

## 2019-06-19 ENCOUNTER — Other Ambulatory Visit: Payer: Self-pay

## 2019-06-19 ENCOUNTER — Ambulatory Visit (INDEPENDENT_AMBULATORY_CARE_PROVIDER_SITE_OTHER): Payer: BC Managed Care – PPO | Admitting: Obstetrics & Gynecology

## 2019-06-19 VITALS — BP 122/80 | Wt 184.0 lb

## 2019-06-19 DIAGNOSIS — Z98891 History of uterine scar from previous surgery: Secondary | ICD-10-CM

## 2019-06-19 DIAGNOSIS — O0993 Supervision of high risk pregnancy, unspecified, third trimester: Secondary | ICD-10-CM

## 2019-06-19 DIAGNOSIS — O34219 Maternal care for unspecified type scar from previous cesarean delivery: Secondary | ICD-10-CM | POA: Diagnosis not present

## 2019-06-19 DIAGNOSIS — Z3A39 39 weeks gestation of pregnancy: Secondary | ICD-10-CM | POA: Diagnosis not present

## 2019-06-19 DIAGNOSIS — Z01812 Encounter for preprocedural laboratory examination: Secondary | ICD-10-CM | POA: Insufficient documentation

## 2019-06-19 DIAGNOSIS — O26893 Other specified pregnancy related conditions, third trimester: Secondary | ICD-10-CM | POA: Diagnosis not present

## 2019-06-19 DIAGNOSIS — Z20822 Contact with and (suspected) exposure to covid-19: Secondary | ICD-10-CM | POA: Diagnosis not present

## 2019-06-19 LAB — TYPE AND SCREEN
ABO/RH(D): O POS
Antibody Screen: NEGATIVE
Extend sample reason: UNDETERMINED

## 2019-06-19 LAB — CBC
HCT: 35.2 % — ABNORMAL LOW (ref 36.0–46.0)
Hemoglobin: 12 g/dL (ref 12.0–15.0)
MCH: 28.3 pg (ref 26.0–34.0)
MCHC: 34.1 g/dL (ref 30.0–36.0)
MCV: 83 fL (ref 80.0–100.0)
Platelets: 277 10*3/uL (ref 150–400)
RBC: 4.24 MIL/uL (ref 3.87–5.11)
RDW: 13.7 % (ref 11.5–15.5)
WBC: 11.3 10*3/uL — ABNORMAL HIGH (ref 4.0–10.5)
nRBC: 0 % (ref 0.0–0.2)

## 2019-06-19 NOTE — Progress Notes (Signed)
History and Physical  Courtney Wyatt is a 34 y.o. G3P1011 [redacted]w[redacted]d  for Induction of Labor scheduled due to Favorable cervix at term and prior CS at term w labot to 9 cm and arrest of dilation dx .   See labor record for pregnancy highlights.  No recent pain, bleeding, ruptured membranes, or other signs of progressing labor.  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 Also,  has a past surgical history that includes laparoscopy (04/2004); Tonsillectomy (1994); Wisdom tooth extraction; and Cesarean section (N/A, 08/25/2017)., family history includes Breast cancer (age of onset: 13) in her maternal aunt; Depression in her mother; Diabetes in her father; Hyperlipidemia in her mother; Hypertension in her father and mother; Lung cancer in her maternal grandmother.,  reports that she quit smoking about 3 years ago. She has never used smokeless tobacco. She reports previous alcohol use. She reports that she does not use drugs. She has a current medication list which includes the following prescription(s): doxylamine-pyridoxine, multivitamin-prenatal, and sertraline. Also, has No Known Allergies. OB History  Gravida Para Term Preterm AB Living  3 1 1  0 1 1  SAB TAB Ectopic Multiple Live Births  1 0 0 0 1    # Outcome Date GA Lbr Len/2nd Weight Sex Delivery Anes PTL Lv  3 Current           2 Term 08/25/17 [redacted]w[redacted]d  8 lb 9.4 oz (3.895 kg) M CS-LTranv EPI  LIV  1 SAB           Patient denies any other pertinent gynecologic issues.   Review of Systems  Constitutional: Negative for chills, fever and malaise/fatigue.  HENT: Negative for congestion, sinus pain and sore throat.   Eyes: Negative for blurred vision and  pain.  Respiratory: Negative for cough and wheezing.   Cardiovascular: Negative for chest pain and leg swelling.  Gastrointestinal: Negative for abdominal pain, constipation, diarrhea, heartburn, nausea and vomiting.  Genitourinary: Negative for dysuria, frequency, hematuria and urgency.  Musculoskeletal: Negative for back pain, joint pain, myalgias and neck pain.  Skin: Negative for itching and rash.  Neurological: Negative for dizziness, tremors and weakness.  Endo/Heme/Allergies: Does not bruise/bleed easily.  Psychiatric/Behavioral: Negative for depression. The patient is not nervous/anxious and does not have insomnia.     Objective: BP 122/80    Wt 184 lb (83.5 kg)    LMP 09/18/2018 (Exact Date)    BMI 32.59 kg/m  Physical Exam Constitutional:      General: She is not in acute distress.    Appearance: She is well-developed.  Genitourinary:     Pelvic exam was performed with patient supine.     Vagina, uterus and rectum normal.     No lesions in the vagina.     No vaginal bleeding.     No cervical motion tenderness, friability, lesion or polyp.     Uterus is mobile.     Uterus is not enlarged.     No uterine mass detected.    Uterus is midaxial.     No right or left adnexal mass present.     Right adnexa not tender.     Left adnexa not tender.     Genitourinary Comments: CX 2/70/-3, VTX  HENT:     Head: Normocephalic and atraumatic.  No laceration.     Right Ear: Hearing normal.     Left Ear: Hearing normal.     Mouth/Throat:     Pharynx: Uvula midline.  Eyes:     Pupils: Pupils are equal, round, and reactive to light.  Neck:     Thyroid: No thyromegaly.  Cardiovascular:     Rate and Rhythm: Normal rate and regular rhythm.     Heart sounds: No murmur heard.  No friction rub. No gallop.   Pulmonary:     Effort: Pulmonary effort is normal. No respiratory distress.     Breath sounds: Normal breath sounds. No wheezing.  Chest:     Breasts:        Right: No mass,  skin change or tenderness.        Left: No mass, skin change or tenderness.  Abdominal:     General: Bowel sounds are normal. There is no distension.     Palpations: Abdomen is soft.     Tenderness: There is no abdominal tenderness. There is no rebound.  Musculoskeletal:        General: Normal range of motion.     Cervical back: Normal range of motion and neck supple.  Neurological:     Mental Status: She is alert and oriented to person, place, and time.     Cranial Nerves: No cranial nerve deficit.  Skin:    General: Skin is warm and dry.  Psychiatric:        Judgment: Judgment normal.  Vitals reviewed.   FHT 150s  Assessment: Term Pregnancy for Induction of Labor due to Favorable cervix at term and desires for VBAC.  Plan: Patient will undergo induction of labor with pitocin, amniotomy.    VBAC protocol discussed. Risks explained.  Patient has been fully informed of the pros and cons, risks and benefits of continued observation with fetal monitoring versus that of induction of labor.   She understands that there are uncommon risks to induction, which include but are not limited to : frequent or prolonged uterine contractions, fetal distress, uterine rupture, and lack of successful induction.  These risks include all methods including Pitocin.  She also has been informed of the increased risks for Cesarean with induction and should induction not be successful.  Patient consents to the induction plan of management.  Plans to breast feed Plans vasectomy, bilateral tubal ligation for contraception TDaP UTD  Annamarie Major, MD, [facility], [facility] 06/19/2019  11:34 AM

## 2019-06-19 NOTE — Patient Instructions (Signed)
Thank you for choosing [facility]. As part of our ongoing efforts to improve patient experience, we would appreciate your feedback. Please fill out the short survey that you will receive by mail or MyChart. Your opinion is important to [place]!   Labor Induction Thurs am 0500  Labor induction is when steps are taken to cause a pregnant woman to begin the labor process. Most women go into labor on their own between 37 weeks and 42 weeks of pregnancy. When this does not happen or when there is a medical need for labor to begin, steps may be taken to induce labor. Labor induction causes a pregnant woman's uterus to contract. It also causes the cervix to soften (ripen), open (dilate), and thin out (efface). Usually, labor is not induced before 39 weeks of pregnancy unless there is a medical reason to do so. Your health care provider will determine if labor induction is needed. Before inducing labor, your health care provider will consider a number of factors, including:  Your medical condition and your baby's.  How many weeks along you are in your pregnancy.  How mature your baby's lungs are.  The condition of your cervix.  The position of your baby.  The size of your birth canal. What are some reasons for labor induction? Labor may be induced if:  Your health or your baby's health is at risk.  Your pregnancy is overdue by 1 week or more.  Your water breaks but labor does not start on its own.  There is a low amount of amniotic fluid around your baby. You may also choose (elect) to have labor induced at a certain time. Generally, elective labor induction is done no earlier than 39 weeks of pregnancy. What methods are used for labor induction? Methods used for labor induction include:  Prostaglandin medicine. This medicine starts contractions and causes the cervix to dilate and ripen. It can be taken by mouth (orally) or by being inserted into the vagina (suppository).  Inserting a  small, thin tube (catheter) with a balloon into the vagina and then expanding the balloon with water to dilate the cervix.  Stripping the membranes. In this method, your health care provider gently separates amniotic sac tissue from the cervix. This causes the cervix to stretch, which in turn causes the release of a hormone called progesterone. The hormone causes the uterus to contract. This procedure is often done during an office visit, after which you will be sent home to wait for contractions to begin.  Breaking the water. In this method, your health care provider uses a small instrument to make a small hole in the amniotic sac. This eventually causes the amniotic sac to break. Contractions should begin after a few hours.  Medicine to trigger or strengthen contractions. This medicine is given through an IV that is inserted into a vein in your arm. Except for membrane stripping, which can be done in a clinic, labor induction is done in the hospital so that you and your baby can be carefully monitored. How long does it take for labor to be induced? The length of time it takes to induce labor depends on how ready your body is for labor. Some inductions can take up to 2-3 days, while others may take less than a day. Induction may take longer if:  You are induced early in your pregnancy.  It is your first pregnancy.  Your cervix is not ready. What are some risks associated with labor induction? Some risks associated  with labor induction include:  Changes in fetal heart rate, such as being too high, too low, or irregular (erratic).  Failed induction.  Infection in the mother or the baby.  Increased risk of having a cesarean delivery.  Fetal death.  Breaking off (abruption) of the placenta from the uterus (rare).  Rupture of the uterus (very rare). When induction is needed for medical reasons, the benefits of induction generally outweigh the risks. What are some reasons for not inducing  labor? Labor induction should not be done if:  Your baby does not tolerate contractions.  You have had previous surgeries on your uterus, such as a myomectomy, removal of fibroids, or a vertical scar from a previous cesarean delivery.  Your placenta lies very low in your uterus and blocks the opening of the cervix (placenta previa).  Your baby is not in a head-down position.  The umbilical cord drops down into the birth canal in front of the baby.  There are unusual circumstances, such as the baby being very early (premature).  You have had more than 2 previous cesarean deliveries. Summary  Labor induction is when steps are taken to cause a pregnant woman to begin the labor process.  Labor induction causes a pregnant woman's uterus to contract. It also causes the cervix to ripen, dilate, and efface.  Labor is not induced before 39 weeks of pregnancy unless there is a medical reason to do so.  When induction is needed for medical reasons, the benefits of induction generally outweigh the risks. This information is not intended to replace advice given to you by your health care provider. Make sure you discuss any questions you have with your health care provider. Document Revised: 12/24/2016 Document Reviewed: 02/04/2016 Elsevier Patient Education  2020 [employer].

## 2019-06-19 NOTE — Progress Notes (Signed)
  [facility] INDUCTION ASSESSMENT SCHEDULING ALVERTA CACCAMO 10-Jul-1985 Medical record #: [id number] Phone #:  Home Phone [phone]  Mobile [phone]    Prenatal Provider:[facility] Delivering Group:[facility] Proposed admission date/time:06/21/2019 0500 Method of induction:Pitocin  Weight: Filed Weights06/15/21 1108Weight:184 lb (83.5 kg) BMI Body mass index is 32.59 kg/m. HIV Negative HSV Negative EDC Estimated Date of Delivery: 6/21/21based on:LMP  Gestational age on admission: 64 Gravidity/parity:G3P1011  Cervix Score   0 1 2 3   Position  Midposition    Consistency   Soft   Effacement (%)   60-70   Dilation (cm)  1-2    Baby's station -3      Bishop Score:6   Medical induction of labor  select indication(s) below Elective induction ?39 weeks multiparous patient    Provider Signature: Scheduled Letitia Libra Date:06/19/2019 11:38 AM   Call [phone] to finalize the induction date/time  [phone] (07/17)

## 2019-06-20 LAB — SARS CORONAVIRUS 2 (TAT 6-24 HRS): SARS Coronavirus 2: NEGATIVE

## 2019-06-21 ENCOUNTER — Inpatient Hospital Stay
Admission: RE | Admit: 2019-06-21 | Payer: BC Managed Care – PPO | Source: Home / Self Care | Admitting: Obstetrics & Gynecology

## 2019-06-21 ENCOUNTER — Encounter: Payer: Self-pay | Admitting: Obstetrics & Gynecology

## 2019-06-21 ENCOUNTER — Inpatient Hospital Stay: Payer: BC Managed Care – PPO | Admitting: Anesthesiology

## 2019-06-21 ENCOUNTER — Inpatient Hospital Stay
Admission: RE | Admit: 2019-06-21 | Discharge: 2019-06-23 | DRG: 807 | Disposition: A | Discharge status: Home / Self Care | Payer: BC Managed Care – PPO | Attending: Obstetrics & Gynecology | Admitting: Obstetrics & Gynecology

## 2019-06-21 ENCOUNTER — Other Ambulatory Visit: Payer: Self-pay

## 2019-06-21 ENCOUNTER — Encounter: Admission: RE | Payer: Self-pay | Source: Home / Self Care

## 2019-06-21 DIAGNOSIS — O26893 Other specified pregnancy related conditions, third trimester: Secondary | ICD-10-CM | POA: Diagnosis present

## 2019-06-21 DIAGNOSIS — O34219 Maternal care for unspecified type scar from previous cesarean delivery: Principal | ICD-10-CM | POA: Diagnosis present

## 2019-06-21 DIAGNOSIS — Z3A39 39 weeks gestation of pregnancy: Secondary | ICD-10-CM | POA: Diagnosis not present

## 2019-06-21 DIAGNOSIS — Z98891 History of uterine scar from previous surgery: Secondary | ICD-10-CM

## 2019-06-21 DIAGNOSIS — Z20822 Contact with and (suspected) exposure to covid-19: Secondary | ICD-10-CM | POA: Diagnosis present

## 2019-06-21 SURGERY — Surgical Case
Anesthesia: Choice | Laterality: Bilateral

## 2019-06-21 MED ORDER — LACTATED RINGERS IV SOLN
500.0000 mL | INTRAVENOUS | Status: DC | PRN
  Administered 2019-06-21: 500 mL via INTRAVENOUS

## 2019-06-21 MED ORDER — METHYLERGONOVINE MALEATE 0.2 MG/ML IJ SOLN
INTRAMUSCULAR | Status: AC
  Filled 2019-06-21: qty 1

## 2019-06-21 MED ORDER — ONDANSETRON HCL 4 MG/2ML IJ SOLN: 4.0000 mg | INTRAMUSCULAR | Status: DC | PRN

## 2019-06-21 MED ORDER — OXYCODONE-ACETAMINOPHEN 5-325 MG PO TABS: 1.0000 | ORAL_TABLET | ORAL | Status: DC | PRN

## 2019-06-21 MED ORDER — OXYTOCIN-SODIUM CHLORIDE 30-0.9 UT/500ML-% IV SOLN
2.5000 [IU]/h | INTRAVENOUS | Status: DC
  Administered 2019-06-21 (×2): 2.5 [IU]/h via INTRAVENOUS

## 2019-06-21 MED ORDER — SODIUM CHLORIDE 0.9% FLUSH: 3.0000 mL | INTRAVENOUS | Status: DC | PRN

## 2019-06-21 MED ORDER — FENTANYL 2.5 MCG/ML W/ROPIVACAINE 0.15% IN NS 100 ML EPIDURAL (ARMC)
EPIDURAL | Status: DC | PRN
  Administered 2019-06-21: 12 mL/h via EPIDURAL

## 2019-06-21 MED ORDER — BENZOCAINE-MENTHOL 20-0.5 % EX AERO
1.0000 "application " | INHALATION_SPRAY | CUTANEOUS | Status: DC | PRN
  Administered 2019-06-21 – 2019-06-22 (×2): 1 via TOPICAL
  Filled 2019-06-21 (×3): qty 56

## 2019-06-21 MED ORDER — ZOLPIDEM TARTRATE 5 MG PO TABS: 5.0000 mg | ORAL_TABLET | Freq: Every evening | ORAL | Status: DC | PRN

## 2019-06-21 MED ORDER — SODIUM CHLORIDE 0.9 % IV SOLN: 250.0000 mL | INTRAVENOUS | Status: DC | PRN

## 2019-06-21 MED ORDER — COCONUT OIL OIL
1.0000 "application " | TOPICAL_OIL | Status: DC | PRN
  Filled 2019-06-21: qty 120

## 2019-06-21 MED ORDER — BUPIVACAINE HCL (PF) 0.25 % IJ SOLN
INTRAMUSCULAR | Status: DC | PRN
  Administered 2019-06-21 (×2): 4 mL via EPIDURAL

## 2019-06-21 MED ORDER — SENNOSIDES-DOCUSATE SODIUM 8.6-50 MG PO TABS
2.0000 | ORAL_TABLET | ORAL | Status: DC
  Administered 2019-06-22 – 2019-06-23 (×2): 2 via ORAL
  Filled 2019-06-21 (×2): qty 2

## 2019-06-21 MED ORDER — SERTRALINE HCL 25 MG PO TABS
50.0000 mg | ORAL_TABLET | Freq: Every day | ORAL | Status: DC
  Administered 2019-06-22 – 2019-06-23 (×2): 50 mg via ORAL
  Filled 2019-06-21 (×2): qty 2

## 2019-06-21 MED ORDER — FENTANYL 2.5 MCG/ML W/ROPIVACAINE 0.15% IN NS 100 ML EPIDURAL (ARMC)
EPIDURAL | Status: AC
  Filled 2019-06-21: qty 100

## 2019-06-21 MED ORDER — SODIUM CHLORIDE 0.9% FLUSH: 3.0000 mL | Freq: Two times a day (BID) | INTRAVENOUS | Status: DC

## 2019-06-21 MED ORDER — AMMONIA AROMATIC IN INHA
RESPIRATORY_TRACT | Status: AC
  Filled 2019-06-21: qty 10

## 2019-06-21 MED ORDER — OXYCODONE-ACETAMINOPHEN 5-325 MG PO TABS: 2.0000 | ORAL_TABLET | ORAL | Status: DC | PRN

## 2019-06-21 MED ORDER — LIDOCAINE HCL (PF) 1 % IJ SOLN
INTRAMUSCULAR | Status: AC
  Filled 2019-06-21: qty 30

## 2019-06-21 MED ORDER — OXYTOCIN-SODIUM CHLORIDE 30-0.9 UT/500ML-% IV SOLN
1.0000 m[IU]/min | INTRAVENOUS | Status: DC
  Administered 2019-06-21: 1 m[IU]/min via INTRAVENOUS
  Filled 2019-06-21: qty 500

## 2019-06-21 MED ORDER — IBUPROFEN 600 MG PO TABS
600.0000 mg | ORAL_TABLET | Freq: Four times a day (QID) | ORAL | Status: DC
  Administered 2019-06-22 – 2019-06-23 (×5): 600 mg via ORAL
  Filled 2019-06-21 (×6): qty 1

## 2019-06-21 MED ORDER — SIMETHICONE 80 MG PO CHEW: 80.0000 mg | CHEWABLE_TABLET | ORAL | Status: DC | PRN

## 2019-06-21 MED ORDER — LIDOCAINE HCL (PF) 1 % IJ SOLN: 30.0000 mL | INTRAMUSCULAR | Status: DC | PRN

## 2019-06-21 MED ORDER — ACETAMINOPHEN 325 MG PO TABS: 650.0000 mg | ORAL_TABLET | ORAL | Status: DC | PRN

## 2019-06-21 MED ORDER — IBUPROFEN 600 MG PO TABS
ORAL_TABLET | ORAL | Status: AC
  Administered 2019-06-21: 600 mg
  Filled 2019-06-21: qty 1

## 2019-06-21 MED ORDER — WITCH HAZEL-GLYCERIN EX PADS
1.0000 "application " | MEDICATED_PAD | CUTANEOUS | Status: DC | PRN
  Administered 2019-06-22: 1 via TOPICAL
  Filled 2019-06-21 (×2): qty 100

## 2019-06-21 MED ORDER — BENZOCAINE-MENTHOL 20-0.5 % EX AERO
INHALATION_SPRAY | CUTANEOUS | Status: AC
  Filled 2019-06-21: qty 56

## 2019-06-21 MED ORDER — ONDANSETRON HCL 4 MG/2ML IJ SOLN
4.0000 mg | Freq: Four times a day (QID) | INTRAMUSCULAR | Status: DC | PRN
  Administered 2019-06-21 (×2): 4 mg via INTRAVENOUS
  Filled 2019-06-21 (×2): qty 2

## 2019-06-21 MED ORDER — DIBUCAINE (PERIANAL) 1 % EX OINT
1.0000 "application " | TOPICAL_OINTMENT | CUTANEOUS | Status: DC | PRN
  Administered 2019-06-22: 1 via RECTAL
  Filled 2019-06-21 (×3): qty 28

## 2019-06-21 MED ORDER — CARBOPROST TROMETHAMINE 250 MCG/ML IM SOLN
INTRAMUSCULAR | Status: AC
  Filled 2019-06-21: qty 1

## 2019-06-21 MED ORDER — MISOPROSTOL 200 MCG PO TABS
ORAL_TABLET | ORAL | Status: AC
  Filled 2019-06-21: qty 4

## 2019-06-21 MED ORDER — ONDANSETRON HCL 4 MG PO TABS: 4.0000 mg | ORAL_TABLET | ORAL | Status: DC | PRN

## 2019-06-21 MED ORDER — BUTORPHANOL TARTRATE 1 MG/ML IJ SOLN: 1.0000 mg | INTRAMUSCULAR | Status: DC | PRN

## 2019-06-21 MED ORDER — TERBUTALINE SULFATE 1 MG/ML IJ SOLN: 0.2500 mg | Freq: Once | INTRAMUSCULAR | Status: DC | PRN

## 2019-06-21 MED ORDER — LACTATED RINGERS IV SOLN: INTRAVENOUS | Status: DC

## 2019-06-21 MED ORDER — ACETAMINOPHEN 325 MG PO TABS
650.0000 mg | ORAL_TABLET | ORAL | Status: DC | PRN
  Administered 2019-06-22 (×2): 650 mg via ORAL
  Filled 2019-06-21 (×2): qty 2

## 2019-06-21 MED ORDER — OXYTOCIN 10 UNIT/ML IJ SOLN
INTRAMUSCULAR | Status: AC
  Filled 2019-06-21: qty 2

## 2019-06-21 MED ORDER — OXYTOCIN BOLUS FROM INFUSION
333.0000 mL | Freq: Once | INTRAVENOUS | Status: AC
  Administered 2019-06-21: 333 mL via INTRAVENOUS

## 2019-06-21 MED ORDER — DIPHENHYDRAMINE HCL 25 MG PO CAPS: 25.0000 mg | ORAL_CAPSULE | Freq: Four times a day (QID) | ORAL | Status: DC | PRN

## 2019-06-21 MED ORDER — LIDOCAINE HCL (PF) 1 % IJ SOLN
INTRAMUSCULAR | Status: DC | PRN
  Administered 2019-06-21: 3 mL via SUBCUTANEOUS

## 2019-06-21 NOTE — Anesthesia Preprocedure Evaluation (Signed)
Anesthesia Evaluation  Patient identified by MRN, date of birth, ID band Patient awake    Reviewed: Allergy & Precautions, H&P , NPO status , Patient's Chart, lab work & pertinent test results  History of Anesthesia Complications (+) PONV and history of anesthetic complications  Airway Mallampati: II       Dental no notable dental hx.    Pulmonary former smoker,           Cardiovascular      Neuro/Psych PSYCHIATRIC DISORDERS Anxiety Depression    GI/Hepatic   Endo/Other    Renal/GU      Musculoskeletal   Abdominal   Peds  Hematology   Anesthesia Other Findings   Reproductive/Obstetrics                             Anesthesia Physical Anesthesia Plan  ASA: II  Anesthesia Plan: Epidural   Post-op Pain Management:    Induction:   PONV Risk Score and Plan:   Airway Management Planned:   Additional Equipment:   Intra-op Plan:   Post-operative Plan:   Informed Consent: I have reviewed the patients History and Physical, chart, labs and discussed the procedure including the risks, benefits and alternatives for the proposed anesthesia with the patient or authorized representative who has indicated his/her understanding and acceptance.       Plan Discussed with: Anesthesiologist  Anesthesia Plan Comments:         Anesthesia Quick Evaluation

## 2019-06-21 NOTE — Discharge Instructions (Signed)

## 2019-06-21 NOTE — Progress Notes (Signed)
°   06/21/19 1130  Clinical Encounter Type  Visited With Patient  Visit Type Follow-up  Referral From Nurse  Consult/Referral To Chaplain  Ch responded to OR from AD education. Talked to Pt about AD. Pt said she was going to read over the AD and then make a decision. Ch will follow-up with PT.

## 2019-06-21 NOTE — Progress Notes (Signed)
°  Labor Progress Note   34 y.o. [id number] @ [redacted]w[redacted]d , admitted for  Pregnancy, Labor Management.   Subjective:  Complete.  Min pain w epidural  Objective:  BP (!) 107/45 (BP Location: Right Arm)    Pulse 65    Temp 97.6 F (36.4 C) (Oral)    Resp 16    Ht 5\' 3"  (1.6 m)    Wt 83.5 kg    LMP 09/18/2018 (Exact Date)    SpO2 100%    BMI 32.59 kg/m  Abd: gravid, ND, FHT present, without guarding, without rebound tenderness on exam Extr: trace to 1+ bilateral pedal edema SVE: CERVIX: 10 cm dilated, 100 effaced, 0 station  EFM: FHR: 140 bpm, variability: moderate,  accelerations:  Present,  decelerations:  Absent Toco:Frequency: Every 2-3 minutes Labs: I have reviewed the patient's lab results.   Assessment & Plan:  09/20/2018 @ [redacted]w[redacted]d, admitted for  Pregnancy and Labor/Delivery Management  1. Pain management: epidural. 2. FWB: FHT category 1.  3. ID: GBS negative 4. Labor management: Second stage  All discussed with patient, see orders  [redacted]w[redacted]d, MD, [facility], [facility] 06/21/2019  7:00 PM

## 2019-06-21 NOTE — Discharge Summary (Signed)
Postpartum Discharge Summary       Patient Name: Courtney Wyatt DOB: [birth year] MRN: [id number]  Date of admission: 06/21/2019 Delivery date:06/21/2019  Delivering provider: Gae Dry  Date of discharge: 06/23/2019  Admitting diagnosis: Induction of labor Intrauterine pregnancy: [redacted]w[redacted]d    Secondary diagnosis:  Active Problems:   History of cesarean delivery   Additional problems: None    Discharge diagnosis: Term Pregnancy Delivered and VBAC, Vacuum assisted vaginal delivery for maternal exhaustion,  postpartum care after vaginal delivery                                           Post partum procedures:none Augmentation: AROM and Pitocin Complications: None  Hospital course: Induction of Labor With Vaginal Delivery   34y.o. yo G3P1011 at 384w3das admitted to the hospital 06/21/2019 for induction of labor.  Indication for induction: Favorable cervix at term and prior cesrean desire for VBAC.  Patient had an uncomplicated labor course as follows: Membrane Rupture Time/Date: 2:10 PM ,06/21/2019   Delivery Method:Vaginal, Vacuum (Extractor)  for maternal exhaustion Episiotomy: None  Lacerations:  1st degree  Details of delivery can be found in separate delivery note.  Patient had a routine postpartum course. On PPD #2 patient was voiding without difficulty, tolerating a regular diet, and ambulating without assist Patient is discharged home 06/23/19.  Newborn Data: Birth date:06/21/2019  Birth time:8:55 PM  Gender:Female  Living status:Living  Apgars:8 ,9  Weight:3250 g   Magnesium Sulfate received: No BMZ received: No Rhophylac:No MMR:No T-DaP:Given prenatally Flu: Yes Transfusion:No  Physical exam  Vitals:   06/22/19 1159 06/22/19 1527 06/22/19 2338 06/23/19 0757  BP: 111/68 115/74 122/75 113/70  Pulse: 71 68 71 62  Resp: _0 Temp: 97.8 F (36.6 C) 97.8 F (36.6 C) 97.9 F (36.6 C) 98 F (36.7 C)  TempSrc: Oral Oral Oral Oral  SpO2: 100% 98% 99%  100%  Weight:      Height:       General: alert, cooperative and no distress Lochia: appropriate Uterine Fundus: firm/ U-1/ ML/ NT Perineum: perineal area slightly bruised, minimal swelling DVT Evaluation: No evidence of DVT seen on physical exam. Labs: Lab Results  Component Value Date   WBC 19.8 (H) 06/22/2019   HGB 12.1 06/22/2019   HCT 35.6 (L) 06/22/2019   MCV 83.6 06/22/2019   PLT 260 06/22/2019   CMP Latest Ref Rng & Units 03/15/2015  Glucose 65 - 99 mg/dL 111(H)  BUN 6 - 20 mg/dL 15  Creatinine 0.44 - 1.00 mg/dL 0.80  Sodium 135 - 145 mmol/L 140  Potassium 3.5 - 5.1 mmol/L 4.1  Chloride 101 - 111 mmol/L 109  CO2 22 - 32 mmol/L 19(L)  Calcium 8.9 - 10.3 mg/dL 10.1  Total Protein 6.5 - 8.1 g/dL 8.2(H)  Total Bilirubin 0.3 - 1.2 mg/dL 1.1  Alkaline Phos 38 - 126 U/L 56  AST 15 - 41 U/L 21  ALT 14 - 54 U/L 18   Edinburgh Score: Edinburgh Postnatal Depression Scale Screening Tool 06/22/2019  I have been able to laugh and see the funny side of things. 0  I have looked forward with enjoyment to things. 0  I have blamed myself unnecessarily when things went wrong. 1  I have been anxious or worried for no good reason. 1  I have felt scared  or panicky for no good reason. 0  Things have been getting on top of me. 0  I have been so unhappy that I have had difficulty sleeping. 0  I have felt sad or miserable. 0  I have been so unhappy that I have been crying. 0  The thought of harming myself has occurred to me. 0  Edinburgh Postnatal Depression Scale Total 2      After visit meds:  Allergies as of 06/23/2019   No Known Allergies     Medication List    TAKE these medications   acetaminophen 325 MG tablet Commonly known as: Tylenol Take 2 tablets (650 mg total) by mouth every 4 (four) hours as needed for mild pain or headache (for pain scale < 4).   ibuprofen 600 MG tablet Commonly known as: ADVIL Take 1 tablet (600 mg total) by mouth every 6 (six) hours as  needed.   multivitamin-prenatal 27-0.8 MG Tabs tablet Take 1 tablet by mouth at bedtime.   sertraline 50 MG tablet Commonly known as: ZOLOFT TAKE ONE TABLET EVERY DAY What changed:   how much to take  how to take this  when to take this        Discharge home in stable condition Infant Feeding: Breast Infant Disposition:home with mother Discharge instruction: per After Visit Summary and Postpartum booklet. Activity: Advance as tolerated. Pelvic rest for 6 weeks.  Diet: routine diet Anticipated Birth Control: plans vasec for husband Postpartum Appointment:6 weeks Additional Postpartum F/U: none Future Appointments:No future appointments. Follow up Visit:  Follow-up Information    Gae Dry, MD. Schedule an appointment as soon as possible for a visit in 2 week(s).   Specialty: Obstetrics and Gynecology Why: Postpartum mood check Contact information: [address] [place] [address] [phone]                   06/23/2019 Dalia Heading, CNM

## 2019-06-21 NOTE — Anesthesia Procedure Notes (Signed)
Epidural Patient location during procedure: OB Start time: 06/21/2019 3:39 PM End time: 06/21/2019 3:57 PM  Staffing Anesthesiologist: Yevette Edwards, MD Resident/CRNA: Irving Burton, CRNA Performed: resident/CRNA   Preanesthetic Checklist Completed: patient identified, IV checked, site marked, risks and benefits discussed, surgical consent, monitors and equipment checked and pre-op evaluation  Epidural Patient position: sitting Prep: ChloraPrep and site prepped and draped Patient monitoring: heart rate, continuous pulse ox and blood pressure Approach: midline Location: L3-L4 Injection technique: LOR air  Needle:  Needle type: Tuohy  Needle gauge: 17 G Needle length: 9 cm Needle insertion depth: 5.5 cm Catheter type: closed end flexible Catheter size: 19 Gauge Catheter at skin depth: 11 cm Test dose: negative and 1.5% lidocaine with Epi 1:200 K  Assessment Events: blood not aspirated, injection not painful, no injection resistance, no paresthesia and negative IV test  Additional Notes Reason for block:procedure for pain

## 2019-06-21 NOTE — H&P (Signed)
History and Physical Interval Note:  06/21/2019 6:42 AM  Courtney Wyatt  has presented today for INDUCTION OF LABOR (pitocin, amniotomy),  with the diagnosis of Favorable cervix at term and prior CS for arrest of dilation at 9 cm. The various methods of treatment have been discussed with the patient and family. After consideration of risks, benefits and other options for treatment, the patient has consented to  Labor induction .  The patient's history has been reviewed, patient examined, no change in status, and is stable for induction as planned.  See H&P. I have reviewed the patient's chart and labs.  Questions were answered to the patient's satisfaction.    Exam unchanged from last office visit (2-3/70/-3).  NST Reactive.  [place]- Vtx.  No prostaglandins due to prior uterine surgery.  Pitocin started.  Plan AROM when appropriate.  Plan epidural as needed.  Clinic [facility] Prenatal Labs  Dating LMP confimred by 8 wk [place] Blood type: O/Positive/-- (11/10 1405)   Genetic Screen Declines Antibody:Negative (11/10 1405)  Anatomic [place] Nml Rubella: 4.36 (11/10 1405) Varicella: Imm  GTT Third trimester:  RPR: Non Reactive (03/26 1009)   Rhogam O+ HBsAg: Negative (11/10 1405)   TDaP vaccine   04/2019   Flu Shot: 10/2018 HIV: Non Reactive (03/26 1009)   Baby Food  Breast LEX:NTZGYFVC/-- (05/28 1638)  Contraception  Vasec or BTL (if CS) Pap:2018; due PP  CBB   No   CS/VBAC  VBAC desired Last pregnancy CS for FTP, 2019  Support Person Dylan, Husband PH to deliver    Annamarie Major, MD, [facility], [facility] 06/21/2019  6:42 AM

## 2019-06-21 NOTE — Progress Notes (Signed)
  Labor Progress Note   34 y.o. [id number] @ [redacted]w[redacted]d , admitted for  Pregnancy, Labor Management.   Subjective:  Pain after AROM Cleat fluid Ctxs on Pitocin 11 mU/min every 2-3 min  Objective:  BP 114/62 (BP Location: Right Arm)   Pulse 61   Temp 98.4 F (36.9 C) (Oral)   Resp 16   Ht 5\' 3"  (1.6 m)   Wt 83.5 kg   LMP 09/18/2018 (Exact Date)   BMI 32.59 kg/m  Abd: gravid, ND, FHT present, moderate tenderness on exam Extr: trace to 1+ bilateral pedal edema SVE: CERVIX: 4 cm dilated, 80 effaced, -3 station  EFM: FHR: 140 bpm, variability: moderate,  accelerations:  Present,  decelerations:  Absent Toco: Frequency: Every 2 minutes Labs: I have reviewed the patient's lab results.   Assessment & Plan:  09/20/2018 @ [redacted]w[redacted]d, admitted for  Pregnancy and Labor/Delivery Management  1. Pain management: none. 2. FWB: FHT category 1.  3. ID: GBS negative 4. Labor management: Cont Active Mgt of labor as a VBAC Epidural when ready  All discussed with patient, see orders  [redacted]w[redacted]d, MD, Annamarie Major Ob/Gyn, [facility] 06/21/2019  2:32 PM

## 2019-06-22 LAB — CBC
HCT: 35.6 % — ABNORMAL LOW (ref 36.0–46.0)
Hemoglobin: 12.1 g/dL (ref 12.0–15.0)
MCH: 28.4 pg (ref 26.0–34.0)
MCHC: 34 g/dL (ref 30.0–36.0)
MCV: 83.6 fL (ref 80.0–100.0)
Platelets: 260 10*3/uL (ref 150–400)
RBC: 4.26 MIL/uL (ref 3.87–5.11)
RDW: 13.9 % (ref 11.5–15.5)
WBC: 19.8 10*3/uL — ABNORMAL HIGH (ref 4.0–10.5)
nRBC: 0 % (ref 0.0–0.2)

## 2019-06-22 MED ORDER — CALCIUM CARBONATE ANTACID 500 MG PO CHEW
1.0000 | CHEWABLE_TABLET | ORAL | Status: DC | PRN
  Administered 2019-06-22: 200 mg via ORAL

## 2019-06-22 NOTE — Anesthesia Postprocedure Evaluation (Signed)
Anesthesia Post Note  Patient: Courtney Wyatt  Procedure(s) Performed: AN AD HOC LABOR EPIDURAL  Patient location during evaluation: Mother Baby Anesthesia Type: Epidural Level of consciousness: awake and alert Pain management: pain level controlled Vital Signs Assessment: post-procedure vital signs reviewed and stable Respiratory status: spontaneous breathing, nonlabored ventilation and respiratory function stable Cardiovascular status: stable Postop Assessment: no headache, no backache, epidural receding and able to ambulate Anesthetic complications: no   No complications documented.   Last Vitals:  Vitals:   06/22/19 0046 06/22/19 0404  BP: 112/65 102/61  Pulse: 66 62  Resp: 18 18  Temp: 37 C 36.6 C  SpO2: 98% 95%    Last Pain:  Vitals:   06/22/19 0600  TempSrc:   PainSc: 0-No pain                 Malachi Pro C

## 2019-06-22 NOTE — Lactation Note (Signed)
This note was copied from a baby's chart. Lactation Consultation Note  Patient Name: Courtney Wyatt CWTPE'L Date: 06/22/2019 Reason for consult: Initial assessment   Maternal Data Formula Feeding for Exclusion: No Has patient been taught Hand Expression?: Yes Does the patient have breastfeeding experience prior to this delivery?: Yes Had sore nipples with first and used breast shells for sore nipples, I gave her a set for use as needed.  Some nipple tenderness present, left worse than right, has gel pads and coconut oil, shown how to hand express and how to ensure wide latch.  Feeding Feeding Type: Breast Fed BAby latched easily to right breast after hand expressing colostrum and shaping breast to get deep latch, needed gentle pressure on jaw to widen latch  LATCH Score Latch: Grasps breast easily, tongue down, lips flanged, rhythmical sucking.  Audible Swallowing: Spontaneous and intermittent  Type of Nipple: Everted at rest and after stimulation  Comfort (Breast/Nipple): Filling, red/small blisters or bruises, mild/mod discomfort  Hold (Positioning): Assistance needed to correctly position infant at breast and maintain latch.  LATCH Score: 8  Interventions Interventions: Breast feeding basics reviewed;Assisted with latch;Skin to skin;Hand express;Breast compression;Support pillows  Lactation Tools Discussed/Used WIC Program: No LC name and no written on white board  Consult Status Consult Status: PRN    Dyann Kief 06/22/2019, 10:08 AM

## 2019-06-22 NOTE — Progress Notes (Signed)
Admit Date: 06/21/2019 Today's Date: 06/22/2019  Post Partum Day 1 VAVD  Subjective:  no complaints  Objective: Temp:  [97.6 F (36.4 C)-98.6 F (37 C)] 97.9 F (36.6 C) (06/18 0404) Pulse Rate:  [57-108] 62 (06/18 0404) Resp:  [16-18] 18 (06/18 0404) BP: (102-135)/(45-97) 102/61 (06/18 0404) SpO2:  [95 %-100 %] 95 % (06/18 0404)  Physical Exam:  General: alert, cooperative and no distress Lochia: appropriate Uterine Fundus: firm Incision: none DVT Evaluation: No evidence of DVT seen on physical exam.  Recent Labs    06/19/19 1102 06/22/19 0641  HGB 12.0 12.1  HCT 35.2* 35.6*    Assessment/Plan: Breastfeeding and Infant doing well   LOS: 1 day   Letitia Libra [facility] 06/22/2019, 8:23 AM

## 2019-06-23 MED ORDER — IBUPROFEN 600 MG PO TABS: 600.0000 mg | ORAL_TABLET | Freq: Four times a day (QID) | ORAL | 0 refills | Status: DC | PRN

## 2019-06-23 MED ORDER — ACETAMINOPHEN 325 MG PO TABS: 650.0000 mg | ORAL_TABLET | ORAL | Status: DC | PRN

## 2019-06-23 NOTE — Progress Notes (Signed)
Patient discharged home with infant. Discharge instructions and medication given and reviewed with patient. Patient verbalized understanding. Pt to make f/u appointment in 2 weeks for PP mood check since discharging on weekend. Escorted out by staff.

## 2019-06-23 NOTE — Plan of Care (Signed)
Vs stable; up ad lib; tolerating regular diet; taking motrin and tylenol for pain control; breastfeeding and has not asked for assistance; some soreness to nipples so RN gave pt comfort gels

## 2019-06-28 ENCOUNTER — Telehealth: Payer: Self-pay

## 2019-06-28 NOTE — Telephone Encounter (Signed)
Courtney Wyatt from [place] called for delivery information.  Information given.

## 2019-07-17 ENCOUNTER — Other Ambulatory Visit: Payer: Self-pay | Admitting: Physician Assistant

## 2019-07-17 DIAGNOSIS — F419 Anxiety disorder, unspecified: Secondary | ICD-10-CM

## 2019-07-17 NOTE — Telephone Encounter (Signed)
Filled with 3 tablets (> 3 months overdue but as upcoming appt) Requested Prescriptions  Pending Prescriptions Disp Refills   sertraline (ZOLOFT) 50 MG tablet [Pharmacy Med Name: SERTRALINE HCL 50 MG TAB] 3 tablet 0    Sig: TAKE ONE TABLET EVERY DAY     Psychiatry:  Antidepressants - SSRI Failed - 07/17/2019  2:29 PM      Failed - Valid encounter within last 6 months    Recent Outpatient Visits          10 months ago Cystitis   [facility] [place], Lavella Hammock, PA-C   1 year ago Anxiety   [facility] [place], [place]   2 years ago Dysuria   [facility] [place], [place], [place]   3 years ago Dizziness   [facility] [place], [place], [place]   3 years ago Cystitis   [facility] [place], [place], [place]      Future Appointments            In 2 days Chrismon, Jodell Cipro, PA [facility]

## 2019-07-18 NOTE — Progress Notes (Deleted)
    MyChart Video Visit    Virtual Visit via Video Note   This visit type was conducted due to national recommendations for restrictions regarding the COVID-19 Pandemic (e.g. social distancing) in an effort to limit this patient's exposure and mitigate transmission in our community. This patient is at least at moderate risk for complications without adequate follow up. This format is felt to be most appropriate for this patient at this time. Physical exam was limited by quality of the video and audio technology used for the visit.   Patient location: *** Provider location: ***   Patient: Courtney Wyatt   DOB: 02-04-1985   34 y.o. Female  MRN: [id number] Visit Date: 07/19/2019  Today's healthcare provider: Dortha Kern, PA   No chief complaint on file.  Subjective    URI  This is a new problem. Associated symptoms include coughing, headaches and a sore throat.      {Show patient history (optional):23778::" "}  Medications: Outpatient Medications Prior to Visit  Medication Sig  . acetaminophen (TYLENOL) 325 MG tablet Take 2 tablets (650 mg total) by mouth every 4 (four) hours as needed for mild pain or headache (for pain scale < 4).  . ibuprofen (ADVIL) 600 MG tablet Take 1 tablet (600 mg total) by mouth every 6 (six) hours as needed.  . Prenatal Vit-Fe Fumarate-FA (MULTIVITAMIN-PRENATAL) 27-0.8 MG TABS tablet Take 1 tablet by mouth at bedtime.   . sertraline (ZOLOFT) 50 MG tablet TAKE ONE TABLET EVERY DAY   No facility-administered medications prior to visit.    Review of Systems  Constitutional: Negative.   HENT: Positive for sore throat.   Respiratory: Positive for cough.   Cardiovascular: Negative.   Musculoskeletal: Negative.   Neurological: Positive for headaches.    {Heme  Chem  Endocrine  Serology  Results Review (optional):23779::" "}  Objective    There were no vitals taken for this visit. {Show previous vital signs (optional):23777::" "}  Physical  Exam     Assessment & Plan     ***  No follow-ups on file.     I discussed the assessment and treatment plan with the patient. The patient was provided an opportunity to ask questions and all were answered. The patient agreed with the plan and demonstrated an understanding of the instructions.   The patient was advised to call back or seek an in-person evaluation if the symptoms worsen or if the condition fails to improve as anticipated.  I provided *** minutes of non-face-to-face time during this encounter.  {provider attestation***:1}  Dortha Kern, PA [facility] [phone] (phone) [phone] (fax)  [facility]

## 2019-07-19 ENCOUNTER — Telehealth: Payer: BC Managed Care – PPO | Admitting: Family Medicine

## 2019-07-19 ENCOUNTER — Encounter: Payer: Self-pay | Admitting: Physician Assistant

## 2019-07-19 ENCOUNTER — Telehealth (INDEPENDENT_AMBULATORY_CARE_PROVIDER_SITE_OTHER): Payer: BC Managed Care – PPO | Admitting: Physician Assistant

## 2019-07-19 DIAGNOSIS — F419 Anxiety disorder, unspecified: Secondary | ICD-10-CM

## 2019-07-19 DIAGNOSIS — J0110 Acute frontal sinusitis, unspecified: Secondary | ICD-10-CM | POA: Diagnosis not present

## 2019-07-19 MED ORDER — AMOXICILLIN 875 MG PO TABS: 875.0000 mg | ORAL_TABLET | Freq: Two times a day (BID) | ORAL | 0 refills | Status: AC

## 2019-07-19 NOTE — Progress Notes (Addendum)
MyChart Video Visit    Virtual Visit via Video Note   This visit type was conducted due to national recommendations for restrictions regarding the COVID-19 Pandemic (e.g. social distancing) in an effort to limit this patient's exposure and mitigate transmission in our community. This patient is at least at moderate risk for complications without adequate follow up. This format is felt to be most appropriate for this patient at this time. Physical exam was limited by quality of the video and audio technology used for the visit.   Patient location: Home Provider location: Office    I discussed the limitations of evaluation and management by telemedicine and the availability of in person appointments. The patient expressed understanding and agreed to proceed.   Patient: Courtney Wyatt   DOB: 1985/07/13   34 y.o. Female  MRN: [id number] Visit Date: 07/19/2019  Today's healthcare provider: Trey Sailors, PA-C   Chief Complaint  Patient presents with  . URI   Subjective    Cough This is a new problem. The current episode started 1 to 4 weeks ago. The problem has been unchanged. The cough is productive of sputum. Associated symptoms include a fever (2 days ago 101.5 per pt), headaches, nasal congestion and a sore throat. Pertinent negatives include no chills, postnasal drip, rhinorrhea, shortness of breath or wheezing. The symptoms are aggravated by exercise. Treatments tried: Ibprofen per pt. The treatment provided no relief.   HPI    URI    Associated symptoms inlclude congestion, headache, plugged ear sensation, rhinorrhea and sore throat.  Recent episode started 1 to 4 weeks ago.  The problem has been gradually worsening since onset.  The maximum temperature recorded prior to his arrival was 101 - 101.9 F.  The fever has been present for  less than a day.  Patient  is drinking moderate amounts of fluids.  Past hisotry is significant for  no history of pneumonia or bronchitis.   Patient is not a smoker.       Last edited by Trey Sailors, PA-C on 07/19/2019  8:30 AM. (History)    Son goes to daycare and he came home sick. She is breast feeding.     Medications: Outpatient Medications Prior to Visit  Medication Sig  . acetaminophen (TYLENOL) 325 MG tablet Take 2 tablets (650 mg total) by mouth every 4 (four) hours as needed for mild pain or headache (for pain scale < 4).  . ibuprofen (ADVIL) 600 MG tablet Take 1 tablet (600 mg total) by mouth every 6 (six) hours as needed.  . Prenatal Vit-Fe Fumarate-FA (MULTIVITAMIN-PRENATAL) 27-0.8 MG TABS tablet Take 1 tablet by mouth at bedtime.   . sertraline (ZOLOFT) 50 MG tablet TAKE ONE TABLET EVERY DAY   No facility-administered medications prior to visit.    Review of Systems  Constitutional: Positive for fever (2 days ago 101.5 per pt). Negative for chills and fatigue.  HENT: Positive for congestion, sinus pressure, sinus pain, sneezing and sore throat. Negative for postnasal drip and rhinorrhea.   Respiratory: Positive for cough. Negative for shortness of breath and wheezing.   Neurological: Positive for headaches.      Objective    There were no vitals taken for this visit.   Physical Exam Constitutional:      Appearance: Normal appearance.  Pulmonary:     Effort: Pulmonary effort is normal. No respiratory distress.  Neurological:     Mental Status: She is alert.  Psychiatric:  Mood and Affect: Mood normal.        Behavior: Behavior normal.        Assessment & Plan    1. Acute non-recurrent frontal sinusitis  Counseled on return precautions. Counseled on risks of this medication in breast feeding including diarrhea and rash in infant. Counseled to discontinue medication if breast fed infant is having diarrhea or rash. Counseled that in any case, sinusitis is self limiting in 85% of cases.   - amoxicillin (AMOXIL) 875 MG tablet; Take 1 tablet (875 mg total) by mouth 2 (two) times daily  for 7 days.  Dispense: 14 tablet; Refill: 0  2. Breast feeding status of mother   Return if symptoms worsen or fail to improve.     I discussed the assessment and treatment plan with the patient. The patient was provided an opportunity to ask questions and all were answered. The patient agreed with the plan and demonstrated an understanding of the instructions.   The patient was advised to call back or seek an in-person evaluation if the symptoms worsen or if the condition fails to improve as anticipated.   ITrey Sailors, PA-C, have reviewed all documentation for this visit. The documentation on 07/19/19 for the exam, diagnosis, procedures, and orders are all accurate and complete.   Maryella Shivers [facility] [phone] (phone) [phone] (fax)  [facility]

## 2019-07-19 NOTE — Patient Instructions (Signed)

## 2019-07-20 MED ORDER — SERTRALINE HCL 50 MG PO TABS: 50.0000 mg | ORAL_TABLET | Freq: Every day | ORAL | 1 refills | Status: DC

## 2019-07-27 ENCOUNTER — Other Ambulatory Visit: Payer: Self-pay | Admitting: Gastroenterology

## 2019-08-09 NOTE — Telephone Encounter (Signed)
Just got to this message at 9:48 PM

## 2019-08-10 ENCOUNTER — Other Ambulatory Visit: Payer: Self-pay | Admitting: Obstetrics & Gynecology

## 2019-08-10 MED ORDER — NYSTATIN 100000 UNIT/GM EX CREA
1.0000 | TOPICAL_CREAM | Freq: Two times a day (BID) | CUTANEOUS | 1 refills | Status: DC
Start: 2019-08-10 — End: 2019-12-03

## 2019-08-10 MED ORDER — FLUCONAZOLE 150 MG PO TABS
150.0000 mg | ORAL_TABLET | Freq: Once | ORAL | 1 refills | Status: AC
Start: 2019-08-10 — End: 2019-08-10

## 2019-10-31 ENCOUNTER — Other Ambulatory Visit: Payer: Self-pay

## 2019-10-31 ENCOUNTER — Encounter: Payer: Self-pay | Admitting: Physician Assistant

## 2019-10-31 ENCOUNTER — Ambulatory Visit (INDEPENDENT_AMBULATORY_CARE_PROVIDER_SITE_OTHER): Payer: BC Managed Care – PPO | Admitting: Physician Assistant

## 2019-10-31 VITALS — BP 122/79 | HR 67 | Temp 97.5°F | Wt 147.4 lb

## 2019-10-31 DIAGNOSIS — F909 Attention-deficit hyperactivity disorder, unspecified type: Secondary | ICD-10-CM

## 2019-10-31 DIAGNOSIS — Z124 Encounter for screening for malignant neoplasm of cervix: Secondary | ICD-10-CM | POA: Diagnosis not present

## 2019-10-31 MED ORDER — METHYLPHENIDATE HCL ER (OSM) 36 MG PO TBCR: 36.0000 mg | EXTENDED_RELEASE_TABLET | Freq: Every day | ORAL | 0 refills | Status: DC

## 2019-10-31 NOTE — Progress Notes (Signed)
Established patient visit   Patient: Courtney Wyatt   DOB: 1985/05/18   34 y.o. Female  MRN: [id number] Visit Date: 10/31/2019  Today's healthcare provider: Trey Sailors, PA-C   Chief Complaint  Patient presents with  . Anxiety  I,Kimani Hovis M Raizel Wesolowski,acting as a scribe for Trey Sailors, PA-C.,have documented all relevant documentation on the behalf of Trey Sailors, PA-C,as directed by  Trey Sailors, PA-C while in the presence of Trey Sailors, PA-C.  Subjective    HPI  Anxiety, Follow-up  She was last seen for anxiety 1 years ago. Changes made at last visit include continue Zoloft 50 mg.   She reports good compliance with treatment. She reports good tolerance of treatment. She is not having side effects.   She feels her anxiety is mild and Improved since last visit.  Symptoms: No chest pain Yes difficulty concentrating  No dizziness No fatigue  No feelings of losing control No insomnia  No irritable No palpitations  No panic attacks No racing thoughts  No shortness of breath No sweating  No tremors/shakes    GAD-7 Results No flowsheet data found.  PHQ-9 Scores PHQ9 SCORE ONLY 10/31/2019 01/09/2018  PHQ-9 Total Score 0 0   ADHD: Reports she was diagnosed in 2014 with psychiatry locally. She does not remember the name of that provider or practice. She does however remember that she transferred her care to a psychiatric NP [facility] in [place], [place]. She was seeing this provider most recently and was on concerta prior to her most recent pregnancy. This worked the best for her. She was previously on Adderall IR and XR and she reported a crash at the end of the day. Her previous psychiatric nurse practitioner left the practice since that time. She is currently experiencing difficulties focusing at work. She works as a Scientist, clinical (histocompatibility and immunogenetics) at [employer]. She is currently breastfeeding. No history of cardiac issues, drug abuse, side effects from stimulants. She is not intending to  become pregnant and husband had a vasectomy.   ---------------------------------------------------------------------------------------------------      Medications: Outpatient Medications Prior to Visit  Medication Sig  . ibuprofen (ADVIL) 600 MG tablet Take 1 tablet (600 mg total) by mouth every 6 (six) hours as needed.  . Prenatal Vit-Fe Fumarate-FA (MULTIVITAMIN-PRENATAL) 27-0.8 MG TABS tablet Take 1 tablet by mouth at bedtime.   . sertraline (ZOLOFT) 50 MG tablet Take 1 tablet (50 mg total) by mouth daily.  Marland Kitchen acetaminophen (TYLENOL) 325 MG tablet Take 2 tablets (650 mg total) by mouth every 4 (four) hours as needed for mild pain or headache (for pain scale < 4). (Patient not taking: Reported on 10/31/2019)  . nystatin cream (MYCOSTATIN) Apply 1 application topically 2 (two) times daily. (Patient not taking: Reported on 10/31/2019)   No facility-administered medications prior to visit.    Review of Systems  Constitutional: Negative.   Respiratory: Negative.   Psychiatric/Behavioral: Positive for decreased concentration.      Objective    BP 122/79 (BP Location: Left Arm, Patient Position: Sitting, Cuff Size: Large)   Pulse 67   Temp (!) 97.5 F (36.4 C) (Oral)   Wt 147 lb 6.4 oz (66.9 kg)   SpO2 100%   Breastfeeding Yes   BMI 26.11 kg/m    Physical Exam Constitutional:      Appearance: Normal appearance.  Cardiovascular:     Rate and Rhythm: Normal rate and regular rhythm.     Heart sounds: Normal heart sounds.  Pulmonary:     Effort: Pulmonary effort is normal.     Breath sounds: Normal breath sounds.  Skin:    General: Skin is warm and dry.  Neurological:     General: No focal deficit present.     Mental Status: She is alert and oriented to person, place, and time. Mental status is at baseline.  Psychiatric:        Mood and Affect: Mood normal.        Behavior: Behavior normal.       No results found for any visits on 10/31/19.  Assessment & Plan      1. Attention deficit hyperactivity disorder (ADHD), unspecified ADHD type  Previous formal psychiatric diagnosis from 2014, patient has signed ROI and will let us know the clinic so we can request records. Controlled substance contract signed today. Plant to restart concerta 36 mg daily. Counseled about Q4 month visits. Counseled about concerta with breast feeding - review of limited studies show minimal amounts are excreted into breast milk and minimal to no amounts are detected in infant. Counseled patient about limitations of these studies being retrospective, small in nature, but it does not appear she needs to stop breast feeding. Counseled risks and benefits. Will restart concerta. Her insurance may prefer she try adderall first, though she has tried adderall previously.   2. Cervical cancer screening  Counseled she is due for PAP smear, happy to do this for her at her next visit.   Return in about 3 months (around 01/31/2020) for ADHD .      ITrey Sailors, PA-C, have reviewed all documentation for this visit. The documentation on 11/07/19 for the exam, diagnosis, procedures, and orders are all accurate and complete.  The entirety of the information documented in the History of Present Illness, Review of Systems and Physical Exam were personally obtained by me. Portions of this information were initially documented by [facility] and reviewed by me for thoroughness and accuracy.     Maryella Shivers  [facility] [phone] (phone) [phone] (fax)  [facility]

## 2019-10-31 NOTE — Patient Instructions (Signed)

## 2019-12-01 ENCOUNTER — Other Ambulatory Visit: Payer: Self-pay | Admitting: Physician Assistant

## 2019-12-01 DIAGNOSIS — F909 Attention-deficit hyperactivity disorder, unspecified type: Secondary | ICD-10-CM

## 2019-12-01 NOTE — Telephone Encounter (Signed)
Requested medication (s) are due for refill today: yes  Requested medication (s) are on the active medication list: yes  Last refill:  10/31/19 #30  Future visit scheduled: yes  Notes to clinic:  med not delegated to NT to RF   Requested Prescriptions  Pending Prescriptions Disp Refills   CONCERTA 36 MG CR tablet [Pharmacy Med Name: CONCERTA 36 MG TAB] 30 tablet     Sig: TAKE 1 TABLET BY MOUTH DAILY.      Not Delegated - Psychiatry:  Stimulants/ADHD Failed - 12/01/2019 12:52 PM      Failed - This refill cannot be delegated      Failed - Urine Drug Screen completed in last 360 days      Passed - Valid encounter within last 3 months    Recent Outpatient Visits           1 month ago Attention deficit hyperactivity disorder (ADHD), unspecified ADHD type   [facility] Os[facility]st M, PA-C   4 months ago Acute non-recurrent frontal sinusitis   [facility] [place], Lavella Hammock, [place]   1 year ago Cystitis   [facility], Lavella Hammock, [place]   1 year ago Anxiety   [facility] [place], [place]   3 years ago Dysuria   [facility], Jodell Cipro, [place]       Future Appointments             In 2 months Jodi Marble, Lavella Hammock, PA-C [facility]

## 2019-12-03 ENCOUNTER — Encounter: Payer: Self-pay | Admitting: Physician Assistant

## 2019-12-03 DIAGNOSIS — F909 Attention-deficit hyperactivity disorder, unspecified type: Secondary | ICD-10-CM

## 2019-12-03 MED ORDER — METHYLPHENIDATE HCL ER (OSM) 36 MG PO TBCR: 36.0000 mg | EXTENDED_RELEASE_TABLET | Freq: Every day | ORAL | 0 refills | Status: DC

## 2019-12-03 NOTE — Telephone Encounter (Signed)
L.O.V. was on 10/31/2019 and upcoming appointment on 02/01/2020.

## 2020-01-29 ENCOUNTER — Encounter: Payer: Self-pay | Admitting: Physician Assistant

## 2020-02-01 ENCOUNTER — Telehealth (INDEPENDENT_AMBULATORY_CARE_PROVIDER_SITE_OTHER): Payer: No Typology Code available for payment source | Admitting: Physician Assistant

## 2020-02-01 DIAGNOSIS — F909 Attention-deficit hyperactivity disorder, unspecified type: Secondary | ICD-10-CM | POA: Diagnosis not present

## 2020-02-01 MED ORDER — METHYLPHENIDATE HCL ER (OSM) 36 MG PO TBCR: 36.0000 mg | EXTENDED_RELEASE_TABLET | Freq: Every day | ORAL | 0 refills | Status: DC

## 2020-02-01 MED ORDER — METHYLPHENIDATE HCL ER (OSM) 36 MG PO TBCR
36.0000 mg | EXTENDED_RELEASE_TABLET | Freq: Every day | ORAL | 0 refills | Status: DC
Start: 2020-02-01 — End: 2020-06-03

## 2020-02-01 NOTE — Progress Notes (Signed)
MyChart Video Visit    Virtual Visit via Video Note   This visit type was conducted due to national recommendations for restrictions regarding the COVID-19 Pandemic (e.g. social distancing) in an effort to limit this patient's exposure and mitigate transmission in our community. This patient is at least at moderate risk for complications without adequate follow up. This format is felt to be most appropriate for this patient at this time. Physical exam was limited by quality of the video and audio technology used for the visit.   Patient location: Home Provider location: Office   I discussed the limitations of evaluation and management by telemedicine and the availability of in person appointments. The patient expressed understanding and agreed to proceed.  Patient: Courtney Wyatt   DOB: 1985-02-04   35 y.o. Female  MRN: [id number] Visit Date: 02/01/2020  Today's healthcare provider: Trey Sailors, PA-C   Chief Complaint  Patient presents with  . Anxiety  . ADHD  I,[name],acting as a scribe for Courtney Sailors, PA-C.,have documented all relevant documentation on the behalf of Courtney Sailors, PA-C,as directed by  Courtney Sailors, PA-C while in the presence of Courtney Sailors, PA-C.  Subjective    HPI  Anxiety, Follow-up  She was last seen for anxiety 3 months ago. Changes made at last visit include continue current medication.   She reports good compliance with treatment. She reports good tolerance of treatment. She is not having side effects.   She feels her anxiety is mild and Improved since last visit. She was evaluated at [facility] in mental health by Steffanie Rainwater. She is doing well with the current dose of medication.   Symptoms: No chest pain No difficulty concentrating  No dizziness No fatigue  No feelings of losing control No insomnia  No irritable No palpitations  No panic attacks No racing thoughts  No shortness of breath No sweating   No tremors/shakes    GAD-7 Results No flowsheet data found.  PHQ-9 Scores PHQ9 SCORE ONLY 10/31/2019 01/09/2018  PHQ-9 Total Score 0 0    --------------------------------------------------------------------------------------------------- Follow up for ADHD  The patient was last seen for this 3 months ago. Changes made at last visit include restarted concerta 36 mg daily.  She reports good compliance with treatment. She feels that condition is Improved. She is not having side effects.   -----------------------------------------------------------------------------------------      Medications: Outpatient Medications Prior to Visit  Medication Sig  . ibuprofen (ADVIL) 600 MG tablet Take 1 tablet (600 mg total) by mouth every 6 (six) hours as needed.  . Prenatal Vit-Fe Fumarate-FA (MULTIVITAMIN-PRENATAL) 27-0.8 MG TABS tablet Take 1 tablet by mouth at bedtime.   . [DISCONTINUED] methylphenidate (CONCERTA) 36 MG PO CR tablet Take 1 tablet (36 mg total) by mouth daily.  . [DISCONTINUED] methylphenidate (CONCERTA) 36 MG PO CR tablet Take 1 tablet (36 mg total) by mouth daily.  . [DISCONTINUED] sertraline (ZOLOFT) 50 MG tablet Take 1 tablet (50 mg total) by mouth daily.   No facility-administered medications prior to visit.    Review of Systems  Constitutional: Negative.   Respiratory: Negative.   Psychiatric/Behavioral: Negative for agitation and decreased concentration. The patient is not nervous/anxious.       Objective    There were no vitals taken for this visit.   Physical Exam Constitutional:      Appearance: Normal appearance.  Pulmonary:     Effort: Pulmonary effort is normal. No respiratory distress.  Neurological:     Mental Status: She is alert.  Psychiatric:        Mood and Affect: Mood normal.        Behavior: Behavior normal.        Assessment & Plan    1. Attention deficit hyperactivity disorder (ADHD), unspecified ADHD type  We had signed  an ROI last visit with plans to update with psychiatry clinic who had evaluated her. However, we do not currently have form and as this is a virtual visit will update at next visit with request for records.   - methylphenidate (CONCERTA) 36 MG PO CR tablet; Take 1 tablet (36 mg total) by mouth daily.  Dispense: 30 tablet; Refill: 0 - methylphenidate (CONCERTA) 36 MG PO CR tablet; Take 1 tablet (36 mg total) by mouth daily.  Dispense: 30 tablet; Refill: 0 - methylphenidate (CONCERTA) 36 MG PO CR tablet; Take 1 tablet (36 mg total) by mouth daily.  Dispense: 30 tablet; Refill: 0   No follow-ups on file.     I discussed the assessment and treatment plan with the patient. The patient was provided an opportunity to ask questions and all were answered. The patient agreed with the plan and demonstrated an understanding of the instructions.   The patient was advised to call back or seek an in-person evaluation if the symptoms worsen or if the condition fails to improve as anticipated.   ITrey Sailors, PA-C, have reviewed all documentation for this visit. The documentation on 02/07/20 for the exam, diagnosis, procedures, and orders are all accurate and complete.  The entirety of the information documented in the History of Present Illness, Review of Systems and Physical Exam were personally obtained by me. Portions of this information were initially documented by [facility] and reviewed by me for thoroughness and accuracy.    Maryella Shivers [facility] [phone] (phone) [phone] (fax)  [facility]

## 2020-02-06 ENCOUNTER — Other Ambulatory Visit: Payer: Self-pay | Admitting: Physician Assistant

## 2020-02-06 DIAGNOSIS — F419 Anxiety disorder, unspecified: Secondary | ICD-10-CM

## 2020-06-03 ENCOUNTER — Other Ambulatory Visit: Payer: Self-pay

## 2020-06-03 ENCOUNTER — Encounter: Payer: Self-pay | Admitting: Family Medicine

## 2020-06-03 ENCOUNTER — Ambulatory Visit: Payer: No Typology Code available for payment source | Admitting: Family Medicine

## 2020-06-03 ENCOUNTER — Ambulatory Visit: Payer: Self-pay | Admitting: Physician Assistant

## 2020-06-03 VITALS — BP 127/77 | HR 81 | Temp 98.2°F | Resp 16 | Ht 63.0 in | Wt 144.0 lb

## 2020-06-03 DIAGNOSIS — F909 Attention-deficit hyperactivity disorder, unspecified type: Secondary | ICD-10-CM

## 2020-06-03 DIAGNOSIS — F419 Anxiety disorder, unspecified: Secondary | ICD-10-CM | POA: Diagnosis not present

## 2020-06-03 MED ORDER — METHYLPHENIDATE HCL ER (OSM) 36 MG PO TBCR: 36.0000 mg | EXTENDED_RELEASE_TABLET | Freq: Every day | ORAL | 0 refills | Status: DC

## 2020-06-03 MED ORDER — METHYLPHENIDATE HCL ER (OSM) 36 MG PO TBCR
36.0000 mg | EXTENDED_RELEASE_TABLET | Freq: Every day | ORAL | 0 refills | Status: DC
Start: 2020-06-03 — End: 2020-09-09

## 2020-06-03 NOTE — Progress Notes (Signed)
I,April Miller,acting as a Neurosurgeon for [employer], PA-C.,have documented all relevant documentation on the behalf of Dortha Kern, PA-C,as directed by  [employer], PA-C while in the presence of [employer], PA-C.  Established patient visit   Patient: Courtney Wyatt   DOB: Jun 07, 1985   35 y.o. Female  MRN: [id number] Visit Date: 06/03/2020  Today's healthcare provider: Dortha Kern, PA-C   Chief Complaint  Patient presents with  . Follow-up   Subjective    HPI  Follow up for ADD  The patient was last seen for this 4 months ago.  Changes made at last visit include no medication changes. Continue Concerta 36mg  daily.   She reports good compliance with treatment. She feels that condition is Improved. She is not having side effects.    Past Medical History:  Diagnosis Date  . ADD (attention deficit disorder)   . Anxiety   . Bacterial vaginosis   . Depression   . 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 History of Papanicolaou smear of cervix 07/10/2013; 10/21/2014   NEG; NEG  . 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 Interstitial cystitis   . PONV (postoperative nausea and vomiting)   . SAB (spontaneous abortion) 07/14/2016   Past Surgical History:  Procedure Laterality Date  . CESAREAN SECTION N/A 08/25/2017   Procedure: CESAREAN SECTION;  Surgeon: 08/27/2017, MD;  Location: [facility] BIRTHING SUITES;  Service: Obstetrics;  Laterality: N/A;  . LAPAROSCOPY  04/2004  . TONSILLECTOMY  1994   DR.  05/2004  . WISDOM TOOTH EXTRACTION     Social History   Tobacco Use  . Smoking status: Former Smoker    Quit date: 10/21/2015    Years since quitting: 4.6  . Smokeless tobacco: Never Used  Vaping Use  . Vaping Use: Never used  Substance Use Topics  . Alcohol use: Not Currently  . Drug use: No   Family History  Problem Relation Age of Onset  . Hyperlipidemia Mother   . Depression Mother   . Hypertension Mother   . Hypertension Father   . Diabetes  Father   . Breast cancer Maternal Aunt 65  . Lung cancer Maternal Grandmother    No Known Allergies     Medications: Outpatient Medications Prior to Visit  Medication Sig  . ibuprofen (ADVIL) 600 MG tablet Take 1 tablet (600 mg total) by mouth every 6 (six) hours as needed.  . methylphenidate (CONCERTA) 36 MG PO CR tablet Take 1 tablet (36 mg total) by mouth daily.  . methylphenidate (CONCERTA) 36 MG PO CR tablet Take 1 tablet (36 mg total) by mouth daily.  . methylphenidate (CONCERTA) 36 MG PO CR tablet Take 1 tablet (36 mg total) by mouth daily.  . sertraline (ZOLOFT) 50 MG tablet TAKE ONE TABLET EVERY DAY  . Prenatal Vit-Fe Fumarate-FA (MULTIVITAMIN-PRENATAL) 27-0.8 MG TABS tablet Take 1 tablet by mouth at bedtime.    No facility-administered medications prior to visit.    Review of Systems  Constitutional: Negative for activity change and fatigue.  Respiratory: Negative for cough and shortness of breath.   Cardiovascular: Negative for chest pain and leg swelling.  Musculoskeletal: Negative for arthralgias and joint swelling.  Neurological: Negative for dizziness, light-headedness and headaches.  Psychiatric/Behavioral: Negative for agitation, self-injury, sleep disturbance and suicidal ideas. The patient is not nervous/anxious.        Objective    BP 127/77 (BP Location: Right Arm, Patient Position: Sitting, Cuff Size: Normal)  Pulse 81   Temp 98.2 F (36.8 C) (Oral)   Resp 16   Ht 5\' 3"  (1.6 m)   Wt 144 lb (65.3 kg)   SpO2 97%   BMI 25.51 kg/m  BP Readings from Last 3 Encounters:  06/03/20 127/77  10/31/19 122/79  06/19/19 122/80   Wt Readings from Last 3 Encounters:  06/03/20 144 lb (65.3 kg)  10/31/19 147 lb 6.4 oz (66.9 kg)  06/19/19 184 lb (83.5 kg)     Physical Exam Constitutional:      General: She is not in acute distress.    Appearance: She is well-developed.  HENT:     Head: Normocephalic and atraumatic.     Right Ear: Hearing normal.      Left Ear: Hearing normal.     Nose: Nose normal.  Eyes:     General: Lids are normal. No scleral icterus.       Right eye: No discharge.        Left eye: No discharge.     Conjunctiva/sclera: Conjunctivae normal.  Cardiovascular:     Rate and Rhythm: Normal rate and regular rhythm.     Pulses: Normal pulses.     Heart sounds: Normal heart sounds.  Pulmonary:     Effort: Pulmonary effort is normal. No respiratory distress.     Breath sounds: Normal breath sounds.  Abdominal:     General: Bowel sounds are normal.     Palpations: Abdomen is soft.  Musculoskeletal:        General: Normal range of motion.     Cervical back: Normal range of motion and neck supple.  Skin:    Findings: No lesion or rash.  Neurological:     Mental Status: She is alert and oriented to person, place, and time.  Psychiatric:        Speech: Speech normal.        Behavior: Behavior normal.        Thought Content: Thought content normal.      No results found for any visits on 06/03/20.  Assessment & Plan    1. Attention deficit hyperactivity disorder (ADHD), unspecified ADHD type Normal focus and accomplishing tasks at work. Good appetite, no tremor, normal sleep pattern and no headaches. Refilled Concerta and will get follow up labs. Had no problems with breastfeeding while using Concerta and no infant side effects. Has stopped breastfeeding now. - CBC with Differential/Platelet - Comprehensive metabolic panel - TSH  2. Anxiety Well controlled by use of Sertraline 50 mg qd. Helped keep postpartum depression from occurring after childbirth 06-19-19. Recheck labs. Sleeping well and good appetite. - CBC with Differential/Platelet - Comprehensive metabolic panel - TSH    No follow-ups on file.      I, Agustine Rossitto, PA-C, have reviewed all documentation for this visit. The documentation on 06/03/20 for the exam, diagnosis, procedures, and orders are all accurate and complete.    06/05/20, PA-C  Dortha Kern [phone] (phone) [phone] (fax)  [facility]

## 2020-06-03 NOTE — Telephone Encounter (Signed)
[facility] faxed refill request for the following medications:  methylphenidate (CONCERTA) 36 MG PO CR tablet   Please advise.

## 2020-06-26 LAB — COMPREHENSIVE METABOLIC PANEL
ALT: 19 IU/L (ref 0–32)
AST: 22 IU/L (ref 0–40)
Albumin/Globulin Ratio: 1.8 (ref 1.2–2.2)
Albumin: 4.9 g/dL — ABNORMAL HIGH (ref 3.8–4.8)
Alkaline Phosphatase: 63 IU/L (ref 44–121)
BUN/Creatinine Ratio: 22 (ref 9–23)
BUN: 16 mg/dL (ref 6–20)
Bilirubin Total: 0.6 mg/dL (ref 0.0–1.2)
CO2: 18 mmol/L — ABNORMAL LOW (ref 20–29)
Calcium: 9.8 mg/dL (ref 8.7–10.2)
Chloride: 104 mmol/L (ref 96–106)
Creatinine, Ser: 0.72 mg/dL (ref 0.57–1.00)
Globulin, Total: 2.7 g/dL (ref 1.5–4.5)
Glucose: 76 mg/dL (ref 65–99)
Potassium: 4.8 mmol/L (ref 3.5–5.2)
Sodium: 140 mmol/L (ref 134–144)
Total Protein: 7.6 g/dL (ref 6.0–8.5)
eGFR: 113 mL/min/{1.73_m2} (ref >59)

## 2020-06-26 LAB — CBC WITH DIFFERENTIAL/PLATELET
Basophils Absolute: 0.1 10*3/uL (ref 0.0–0.2)
Basos: 1 %
EOS (ABSOLUTE): 0.1 10*3/uL (ref 0.0–0.4)
Eos: 2 %
Hematocrit: 42.8 % (ref 34.0–46.6)
Hemoglobin: 14.5 g/dL (ref 11.1–15.9)
Immature Grans (Abs): 0 10*3/uL (ref 0.0–0.1)
Immature Granulocytes: 0 %
Lymphocytes Absolute: 2.3 10*3/uL (ref 0.7–3.1)
Lymphs: 38 %
MCH: 29.6 pg (ref 26.6–33.0)
MCHC: 33.9 g/dL (ref 31.5–35.7)
MCV: 87 fL (ref 79–97)
Monocytes Absolute: 0.4 10*3/uL (ref 0.1–0.9)
Monocytes: 6 %
Neutrophils Absolute: 3.3 10*3/uL (ref 1.4–7.0)
Neutrophils: 53 %
Platelets: 333 10*3/uL (ref 150–450)
RBC: 4.9 x10E6/uL (ref 3.77–5.28)
RDW: 11.9 % (ref 11.7–15.4)
WBC: 6.1 10*3/uL (ref 3.4–10.8)

## 2020-06-26 LAB — TSH: TSH: 0.88 u[IU]/mL (ref 0.450–4.500)

## 2020-09-09 ENCOUNTER — Other Ambulatory Visit: Payer: Self-pay | Admitting: Family Medicine

## 2020-09-09 DIAGNOSIS — F909 Attention-deficit hyperactivity disorder, unspecified type: Secondary | ICD-10-CM

## 2020-09-09 MED ORDER — METHYLPHENIDATE HCL ER (OSM) 36 MG PO TBCR: 36.0000 mg | EXTENDED_RELEASE_TABLET | Freq: Every day | ORAL | 0 refills | Status: DC

## 2020-09-09 NOTE — Telephone Encounter (Signed)
Requested medication (s) are due for refill today: no  Requested medication (s) are on the active medication list: yes  Last refill:  09/09/20  Future visit scheduled: no  Notes to clinic:  Please review. Unable to refill or refuse per protocol. Refill not delegated.     Requested Prescriptions  Pending Prescriptions Disp Refills   CONCERTA 36 MG CR tablet [Pharmacy Med Name: CONCERTA 36 MG TAB] 30 tablet     Sig: TAKE 1 TABLET BY MOUTH DAILY.     Not Delegated - Psychiatry:  Stimulants/ADHD Failed - 09/09/2020  2:51 PM      Failed - This refill cannot be delegated      Failed - Urine Drug Screen completed in last 360 days      Failed - Valid encounter within last 3 months    Recent Outpatient Visits           3 months ago Attention deficit hyperactivity disorder (ADHD), unspecified ADHD type   [facility] Chrismon, Jodell Cipro, PA-C   7 months ago Attention deficit hyperactivity disorder (ADHD), unspecified ADHD type   [facility] Osvaldo Angst M, PA-C   10 months ago Attention deficit hyperactivity disorder (ADHD), unspecified ADHD type   [facility] [place], [place], [place]   1 year ago Acute non-recurrent frontal sinusitis   [facility] [place], Lavella Hamm[place], [place]   2 years ago Cystitis   [facility] Osvaldo Angst [place], [place]

## 2020-11-19 ENCOUNTER — Other Ambulatory Visit: Payer: Self-pay | Admitting: Family Medicine

## 2020-11-19 ENCOUNTER — Telehealth: Payer: Self-pay | Admitting: Family Medicine

## 2020-11-19 DIAGNOSIS — F909 Attention-deficit hyperactivity disorder, unspecified type: Secondary | ICD-10-CM

## 2020-11-19 MED ORDER — CONCERTA 36 MG PO TBCR: 36.0000 mg | EXTENDED_RELEASE_TABLET | Freq: Every day | ORAL | 0 refills | Status: DC

## 2020-11-19 NOTE — Telephone Encounter (Unsigned)
Medication Refill - Medication: CONCERTA 36 MG CR tablet  Has the patient contacted their pharmacy? Yes.   Was told to call the office  Preferred Pharmacy (with phone number or street name): [facility] - [place], [place] - [address] Has the patient been seen for an appointment in the last year OR does the patient have an upcoming appointment? Yes.    Agent: Please be advised that RX refills may take up to 3 business days. We ask that you follow-up with your pharmacy.  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  She did not know Maurine Minister was gone. Pt has made appt on a day she leaves early from work, but is going out of country this weekend and asking for a 30 day to get her to her 12/13 appt.

## 2020-12-16 ENCOUNTER — Other Ambulatory Visit: Payer: Self-pay

## 2020-12-16 ENCOUNTER — Encounter: Payer: Self-pay | Admitting: Family Medicine

## 2020-12-16 ENCOUNTER — Ambulatory Visit: Payer: No Typology Code available for payment source | Admitting: Family Medicine

## 2020-12-16 VITALS — BP 131/86 | HR 66 | Resp 15 | Wt 141.9 lb

## 2020-12-16 DIAGNOSIS — Z9151 Personal history of suicidal behavior: Secondary | ICD-10-CM | POA: Diagnosis not present

## 2020-12-16 DIAGNOSIS — F909 Attention-deficit hyperactivity disorder, unspecified type: Secondary | ICD-10-CM | POA: Insufficient documentation

## 2020-12-16 DIAGNOSIS — F419 Anxiety disorder, unspecified: Secondary | ICD-10-CM | POA: Diagnosis not present

## 2020-12-16 MED ORDER — CONCERTA 36 MG PO TBCR: 36.0000 mg | EXTENDED_RELEASE_TABLET | Freq: Every day | ORAL | 0 refills | Status: DC

## 2020-12-16 MED ORDER — SERTRALINE HCL 50 MG PO TABS: 50.0000 mg | ORAL_TABLET | Freq: Every day | ORAL | 3 refills | Status: DC

## 2020-12-16 NOTE — Assessment & Plan Note (Signed)
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

## 2020-12-16 NOTE — Assessment & Plan Note (Signed)
Chronic, stable Continue SSRI

## 2020-12-16 NOTE — Assessment & Plan Note (Signed)
Remote history; anxiety and ADD are stable at this time Reports slight burnout related to holidays and excessive work, trying to pay off debt from [employer] program

## 2020-12-16 NOTE — Progress Notes (Signed)
Established patient visit   Patient: Courtney Wyatt   DOB: 22-Feb-1985   35 y.o. Female  MRN: [id number] Visit Date: 12/16/2020  Today's healthcare provider: [name], [name]   Chief Complaint  Patient presents with   ADHD   Anxiety   Subjective    HPI  Follow up for ADHD  The patient was last seen for this 6 months ago. Changes made at last visit include Refilled Concerta and will get follow up labs.  She reports excellent compliance with treatment. She feels that condition is Improved. She is not having side effects.   -----------------------------------------------------------------------------------------  Anxiety, Follow-up  She was last seen for anxiety 6 months ago. Changes made at last visit include Well controlled by use of Sertraline 50 mg qd.   She reports excellent compliance with treatment. She reports excellent tolerance of treatment. She is not having side effects.   She feels her anxiety is mild and Improved since last visit.  Symptoms: No chest pain No difficulty concentrating  No dizziness No fatigue  No feelings of losing control No insomnia  No irritable No palpitations  No panic attacks No racing thoughts  No shortness of breath No sweating  No tremors/shakes    GAD-7 Results No flowsheet data found.  PHQ-9 Scores PHQ9 SCORE ONLY 12/16/2020 10/31/2019 01/09/2018  PHQ-9 Total Score 1 0 0    ---------------------------------------------------------------------------------------------------   Medications: Outpatient Medications Prior to Visit  Medication Sig   [DISCONTINUED] sertraline (ZOLOFT) 50 MG tablet TAKE ONE TABLET EVERY DAY   [DISCONTINUED] CONCERTA 36 MG CR tablet Take 1 tablet (36 mg total) by mouth daily.   No facility-administered medications prior to visit.    Review of Systems     Objective    BP 131/86    Pulse 66    Resp 15    Wt 141 lb 14.4 oz (64.4 kg)    LMP  (LMP Unknown)    SpO2 100%    Breastfeeding No     BMI 25.14 kg/m    Physical Exam Vitals and nursing note reviewed.  Constitutional:      General: She is not in acute distress.    Appearance: Normal appearance. She is overweight. She is not ill-appearing, toxic-appearing or diaphoretic.  HENT:     Head: Normocephalic and atraumatic.  Cardiovascular:     Rate and Rhythm: Normal rate and regular rhythm.     Pulses: Normal pulses.     Heart sounds: Normal heart sounds. No murmur heard.   No friction rub. No gallop.  Pulmonary:     Effort: Pulmonary effort is normal. No respiratory distress.     Breath sounds: Normal breath sounds. No stridor. No wheezing, rhonchi or rales.  Chest:     Chest wall: No tenderness.  Abdominal:     General: Bowel sounds are normal.     Palpations: Abdomen is soft.  Musculoskeletal:        General: No swelling, tenderness, deformity or signs of injury. Normal range of motion.     Right lower leg: No edema.     Left lower leg: No edema.  Skin:    General: Skin is warm and dry.     Capillary Refill: Capillary refill takes less than 2 seconds.     Coloration: Skin is not jaundiced or pale.     Findings: No bruising, erythema, lesion or rash.  Neurological:     General: No focal deficit present.  Mental Status: She is alert and oriented to person, place, and time. Mental status is at baseline.     Cranial Nerves: No cranial nerve deficit.     Sensory: No sensory deficit.     Motor: No weakness.     Coordination: Coordination normal.  Psychiatric:        Mood and Affect: Mood normal.        Behavior: Behavior normal.        Thought Content: Thought content normal.        Judgment: Judgment normal.     No results found for any visits on 12/16/20.  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      Leilani Merl, [name], have reviewed all documentation for this visit. The documentation on 12/16/20 for the exam, diagnosis, procedures, and orders are all accurate and complete.    [name], [name]  [facility] [phone] (phone) [phone] (fax)  [facility]

## 2021-02-06 ENCOUNTER — Other Ambulatory Visit: Payer: Self-pay | Admitting: Family Medicine

## 2021-02-06 ENCOUNTER — Other Ambulatory Visit: Payer: Self-pay | Admitting: Obstetrics & Gynecology

## 2021-02-06 DIAGNOSIS — F909 Attention-deficit hyperactivity disorder, unspecified type: Secondary | ICD-10-CM

## 2021-02-06 MED ORDER — FLUCONAZOLE 150 MG PO TABS: 150.0000 mg | ORAL_TABLET | Freq: Once | ORAL | 3 refills | Status: AC

## 2021-02-06 MED ORDER — CONCERTA 36 MG PO TBCR: 36.0000 mg | EXTENDED_RELEASE_TABLET | Freq: Every day | ORAL | 0 refills | Status: DC

## 2021-02-06 NOTE — Telephone Encounter (Signed)
Requested medication (s) are due for refill today: Yes  Requested medication (s) are on the active medication list: Yes  Last refill:  12/16/20  Future visit scheduled: No  Notes to clinic:  See request.    Requested Prescriptions  Pending Prescriptions Disp Refills   CONCERTA 36 MG CR tablet [Pharmacy Med Name: CONCERTA 36 MG TAB] 30 tablet     Sig: TAKE ONE TABLET BY MOUTH DAILY     Not Delegated - Psychiatry:  Stimulants/ADHD Failed - 02/06/2021  9:09 AM      Failed - This refill cannot be delegated      Failed - Urine Drug Screen completed in last 360 days      Failed - Valid encounter within last 6 months    Recent Outpatient Visits           1 month ago Attention deficit hyperactivity disorder (ADHD), unspecified ADHD type   [facility] [name], [name]   8 months ago Attention deficit hyperactivity disorder (ADHD), unspecified ADHD type   [facility] Chrismon, Jodell Cipro, PA-C   1 year ago Attention deficit hyperactivity disorder (ADHD), unspecified ADHD type   [facility] [place], Lavella [place]ock, PA-C   1 year ago Attention deficit hyperactivity disorder (ADHD), unspecified ADHD type   [facility] [place], [place], [place]   1 year ago Acute non-recurrent frontal sinusitis   [facility] Jodi Marble, Adriana M, [place]              Passed - Last BP in normal range    BP Readings from Last 1 Encounters:  12/16/20 131/86          Passed - Last Heart Rate in normal range    Pulse Readings from Last 1 Encounters:  12/16/20 66

## 2021-02-06 NOTE — Telephone Encounter (Signed)
Pt called about refill for CONCERTA 36 MG CR tablet / send to [facility] / pt thinks this may need a PA to be filled / Pt is completely out and asked for this asap /please advise

## 2021-02-09 ENCOUNTER — Encounter: Payer: Self-pay | Admitting: Family Medicine

## 2021-02-11 ENCOUNTER — Other Ambulatory Visit: Payer: Self-pay | Admitting: Family Medicine

## 2021-02-11 MED ORDER — METHYLPHENIDATE HCL ER (OSM) 36 MG PO TBCR: 36.0000 mg | EXTENDED_RELEASE_TABLET | Freq: Every day | ORAL | 0 refills | Status: DC

## 2021-02-12 ENCOUNTER — Telehealth: Payer: Self-pay | Admitting: Family Medicine

## 2021-02-12 NOTE — Telephone Encounter (Signed)
Pt called back to follow up. Pt stated she requested this new pharmacy because the previous one did not have the medication as its back ordered.

## 2021-02-12 NOTE — Telephone Encounter (Signed)
Your PA request has been closed. [phone]. Per [facility] through [employer] plan cancelled the PA.

## 2021-02-12 NOTE — Telephone Encounter (Signed)
LM for patient advising and also that Methylphenidate  was sent to her pharmacy yesterday.

## 2021-02-12 NOTE — Telephone Encounter (Signed)
Medication Refill - Medication:methylphenidate 36 MG PO CR tablet  Has the patient contacted their pharmacy? yes (Agent: If no, request that the patient contact the pharmacy for the refill. If patient does not wish to contact the pharmacy document the reason why and proceed with request.) (Agent: If yes, when and what did the pharmacy advise?)contact pcp, her phamacy is out of this med  Preferred Pharmacy (with phone number or street name): [facility] [address] [place]  [address]  [phone] Has the patient been seen for an appointment in the last year OR does the patient have an upcoming appointment? yes  Agent: Please be advised that RX refills may take up to 3 business days. We ask that you follow-up with your pharmacy.

## 2021-02-13 ENCOUNTER — Other Ambulatory Visit: Payer: Self-pay

## 2021-02-13 MED ORDER — METHYLPHENIDATE HCL ER (OSM) 36 MG PO TBCR: 36.0000 mg | EXTENDED_RELEASE_TABLET | Freq: Every day | ORAL | 0 refills | Status: DC

## 2021-02-13 NOTE — Telephone Encounter (Signed)
Pt called saying the pharmacy that had this yesterday does not have this.  She called around and [facility] in [place] has it in stock right now.  She said she has been chasing the medication around town but can't seem to get it sent anywhere in time before it is gone.Marland Kitchen

## 2021-02-20 ENCOUNTER — Encounter: Payer: Self-pay | Admitting: Obstetrics and Gynecology

## 2021-02-20 ENCOUNTER — Ambulatory Visit (INDEPENDENT_AMBULATORY_CARE_PROVIDER_SITE_OTHER): Payer: No Typology Code available for payment source | Admitting: Obstetrics and Gynecology

## 2021-02-20 ENCOUNTER — Other Ambulatory Visit: Payer: Self-pay

## 2021-02-20 VITALS — BP 118/70 | Ht 63.0 in | Wt 143.0 lb

## 2021-02-20 DIAGNOSIS — R875 Abnormal microbiological findings in specimens from female genital organs: Secondary | ICD-10-CM

## 2021-02-20 DIAGNOSIS — N760 Acute vaginitis: Secondary | ICD-10-CM

## 2021-02-20 DIAGNOSIS — A6004 Herpesviral vulvovaginitis: Secondary | ICD-10-CM

## 2021-02-20 DIAGNOSIS — R102 Pelvic and perineal pain: Secondary | ICD-10-CM | POA: Diagnosis not present

## 2021-02-20 DIAGNOSIS — N771 Vaginitis, vulvitis and vulvovaginitis in diseases classified elsewhere: Secondary | ICD-10-CM | POA: Diagnosis not present

## 2021-02-20 DIAGNOSIS — K648 Other hemorrhoids: Secondary | ICD-10-CM

## 2021-02-20 MED ORDER — VALACYCLOVIR HCL 1 G PO TABS: 1000.0000 mg | ORAL_TABLET | Freq: Every day | ORAL | 0 refills | Status: DC

## 2021-02-20 MED ORDER — CLOBETASOL PROPIONATE 0.05 % EX OINT
TOPICAL_OINTMENT | CUTANEOUS | 0 refills | Status: DC
Start: 2021-02-20 — End: 2021-04-09

## 2021-02-20 MED ORDER — HYDROCORTISONE ACETATE 25 MG RE SUPP: 25.0000 mg | Freq: Two times a day (BID) | RECTAL | 0 refills | Status: DC

## 2021-02-20 NOTE — Progress Notes (Signed)
Patient ID: Courtney Wyatt, female   DOB: 09-24-1985, 36 y.o.   MRN: [id number]  Reason for Consult: Vaginal Discharge (Yeast infection x 3 week)   Referred by [name], [name]  Subjective:     HPI:  Courtney Wyatt is a 36 y.o. female. She is here today with complaints of vulvar pain. She has treated herself several times with over the counter monistat (3 day and 7 day course) as well as diflucan without improvement in her symptoms. She has some itching which has transitioned to burning pain. This week she had rectal pain after a bowel movement with small amounts of bright red blood visible. She has a remote history of HSV-1.  Gynecological History  Patient's last menstrual period was 01/19/2021.  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 BIRTHING SUITES;  Service: Obstetrics;  Laterality: N/A;   LAPAROSCOPY  04/2004   TONSILLECTOMY  1994   DR.  Colonel Bald   WISDOM TOOTH EXTRACTION      Short Social History:  Social History   Tobacco Use   Smoking status: Former    Packs/day: 0.50    Years: 5.00    Pack years: 2.50    Types: Cigarettes    Quit date: 10/21/2015    Years since quitting: 5.3   Smokeless tobacco: Never  Substance Use Topics    Alcohol use: Not Currently    No Known Allergies  Current Outpatient Medications  Medication Sig Dispense Refill   methylphenidate 36 MG PO CR tablet Take 1 tablet (36 mg total) by mouth daily. 90 tablet 0   sertraline (ZOLOFT) 50 MG tablet Take 1 tablet (50 mg total) by mouth daily. 90 tablet 3   clobetasol ointment (TEMOVATE) 0.05 % Apply to affected area every night for 2 weeks or until resolution of symptoms 30 g 0   hydrocortisone (ANUSOL-HC) 25 MG suppository Place 1 suppository (25 mg total) rectally 2 (two) times daily. 12 suppository 0   valACYclovir (VALTREX) 1000 MG tablet Take 1 tablet (1,000 mg total) by mouth daily. Take for 5 days 5 tablet 0   No current facility-administered medications for this visit.    Review of Systems  Constitutional: Negative for chills, fatigue, fever and unexpected weight change.  HENT: Negative for trouble swallowing.  Eyes: Negative for loss of vision.  Respiratory: Negative for cough, shortness of breath and wheezing.  Cardiovascular: Negative for chest pain, leg swelling, palpitations and syncope.  GI: Negative for abdominal pain, blood in stool, diarrhea, nausea and vomiting.  GU: Negative for difficulty urinating, dysuria, frequency and hematuria.  Musculoskeletal: Negative for back pain, leg pain and joint pain.  Skin: Negative for rash.  Neurological: Negative for dizziness, headaches, light-headedness, numbness and seizures.  Psychiatric: Negative for behavioral problem, confusion, depressed mood and sleep disturbance.       Objective:  Objective   Vitals:   02/20/21 1505  BP: 118/70  Weight: 143 lb (64.9 kg)  Height: 5\' 3"  (1.6 m)   Body mass index is 25.33 kg/m.  Physical Exam Vitals and nursing note reviewed. Exam conducted with a chaperone present.  Constitutional:      Appearance: Normal appearance. She is well-developed.  HENT:     Head: Normocephalic and atraumatic.  Eyes:     Extraocular Movements: Extraocular  movements intact.     Pupils: Pupils are equal, round, and reactive to light.  Cardiovascular:     Rate and Rhythm: Normal rate and regular rhythm.  Pulmonary:     Effort: Pulmonary effort is normal. No respiratory distress.     Breath sounds: Normal breath sounds.  Abdominal:     General: Abdomen is flat.     Palpations: Abdomen is soft.  Genitourinary:    Comments: External: Normal appearing vulva. No lesions noted. Slight vulvar erythema Speculum examination: Normal appearing cervix. No blood in the vaginal vault. Scant discharge.   Rectal: no external hemorrhoids, erythematous skin surrounding the rectum Musculoskeletal:        General: No signs of injury.  Skin:    General: Skin is warm and dry.  Neurological:     Mental Status: She is alert and oriented to person, place, and time.  Psychiatric:        Behavior: Behavior normal.        Thought Content: Thought content normal.        Judgment: Judgment normal.    Assessment/Plan:     36 yo with vulvar pain and vaginitis No obvious source today other than skin erythema Will trail topical steroid, clobetasol Valtrex for possible prodromal symptoms.  Anusol for suspected internal hemorrhoid.  Nuswab collected for infectious testing.  More than 5 minutes were spent face to face with the patient in the room, reviewing the medical record, labs and images, and coordinating care for the patient. The plan of management was discussed in detail and counseling was provided.    Adrian Prows MD [facility], [facility] 02/20/2021 3:55 PM

## 2021-02-26 LAB — NUSWAB VAGINITIS PLUS (VG+)
Candida albicans, NAA: NEGATIVE
Candida glabrata, NAA: NEGATIVE
Chlamydia trachomatis, NAA: NEGATIVE
Neisseria gonorrhoeae, NAA: NEGATIVE
Trich vag by NAA: NEGATIVE

## 2021-03-30 ENCOUNTER — Other Ambulatory Visit: Payer: Self-pay | Admitting: Obstetrics and Gynecology

## 2021-03-30 ENCOUNTER — Encounter: Payer: Self-pay | Admitting: Obstetrics and Gynecology

## 2021-03-30 DIAGNOSIS — R61 Generalized hyperhidrosis: Secondary | ICD-10-CM

## 2021-03-30 MED ORDER — DRYSOL 20 % EX SOLN: Freq: Every day | CUTANEOUS | 3 refills | Status: DC

## 2021-04-09 ENCOUNTER — Ambulatory Visit (INDEPENDENT_AMBULATORY_CARE_PROVIDER_SITE_OTHER): Payer: No Typology Code available for payment source | Admitting: Obstetrics and Gynecology

## 2021-04-09 ENCOUNTER — Encounter: Payer: Self-pay | Admitting: Obstetrics and Gynecology

## 2021-04-09 VITALS — BP 104/70 | Ht 63.0 in | Wt 140.0 lb

## 2021-04-09 DIAGNOSIS — N750 Cyst of Bartholin's gland: Secondary | ICD-10-CM

## 2021-04-09 MED ORDER — AMOXICILLIN-POT CLAVULANATE 875-125 MG PO TABS: 1.0000 | ORAL_TABLET | Freq: Two times a day (BID) | ORAL | 0 refills | Status: AC

## 2021-04-09 NOTE — Progress Notes (Signed)
? ?Patient ID: Courtney Wyatt, female   DOB: 02-11-1985, 36 y.o.   MRN: [id number] ? ?Reason for Consult: Vaginal Lump (External, Pain/tenderness since Sunday) ?  ?Referred by [name], [name] ? ?Subjective:  ?   ?HPI: ? ?Courtney Wyatt is a 36 y.o. female.  She presents today for complaints of a left-sided vaginal lump which started on Sunday of this week.  She started taking sitz bath with Epsom salts last night.  She reports that she has had increasing pain and tenderness. ? ?Gynecological History ? ?Patient's last menstrual period was 03/16/2021 (approximate). ? ?Past Medical History:  ?Diagnosis Date  ? ADD (attention deficit disorder)   ? Anxiety   ? Bacterial vaginosis   ? Depression   ? Herpes, genital 02/2008  ? HSV TYPE I BY CX  ? History of Papanicolaou smear of cervix 07/10/2013; 10/21/2014  ? NEG; NEG  ? 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 Interstitial cystitis   ? PONV (postoperative nausea and vomiting)   ? SAB (spontaneous abortion) 07/14/2016  ? ?Family History  ?Problem Relation Age of Onset  ? Hyperlipidemia Mother   ? Depression Mother   ? Hypertension Mother   ? Hypertension Father   ? Diabetes Father   ? Breast cancer Maternal Aunt 66  ? Lung cancer Maternal Grandmother   ? ?Past Surgical History:  ?Procedure Laterality Date  ? CESAREAN SECTION N/A 08/25/2017  ? Procedure: CESAREAN SECTION;  Surgeon: Richarda Overlie, MD;  Location: [facility] BIRTHING SUITES;  Service: Obstetrics;  Laterality: N/A;  ? LAPAROSCOPY  04/2004  ? TONSILLECTOMY  1994  ? DR.  Colonel Bald  ? WISDOM TOOTH EXTRACTION    ? ? ?Short Social History:  ?Social History  ? ?Tobacco Use  ? Smoking status: Former  ?  Packs/day: 0.50  ?  Years: 5.00  ?  Pack years: 2.50  ?  Types: Cigarettes  ?  Quit date: 10/21/2015  ?  Years since quitting: 5.4  ? Smokeless tobacco: Never  ?Substance Use Topics  ? Alcohol use: Not Currently  ? ? ?No Known Allergies ? ?Current Outpatient Medications  ?Medication Sig Dispense Refill  ? aluminum chloride (DRYSOL) 20 %  external solution Apply topically at bedtime. 35 mL 3  ? amoxicillin-clavulanate (AUGMENTIN) 875-125 MG tablet Take 1 tablet by mouth 2 (two) times daily for 7 days. 14 tablet 0  ? methylphenidate 36 MG PO CR tablet Take 1 tablet (36 mg total) by mouth daily. 90 tablet 0  ? sertraline (ZOLOFT) 50 MG tablet Take 1 tablet (50 mg total) by mouth daily. 90 tablet 3  ? ?No current facility-administered medications for this visit.  ? ? ?Review of Systems  ?Constitutional: Negative for chills, fatigue, fever and unexpected weight change.  ?HENT: Negative for trouble swallowing.  ?Eyes: Negative for loss of vision.  ?Respiratory: Negative for cough, shortness of breath and wheezing.  ?Cardiovascular: Negative for chest pain, leg swelling, palpitations and syncope.  ?GI: Negative for abdominal pain, blood in stool, diarrhea, nausea and vomiting.  ?GU: Negative for difficulty urinating, dysuria, frequency and hematuria.  ?Musculoskeletal: Negative for back pain, leg pain and joint pain.  ?Skin: Negative for rash.  ?Neurological: Negative for dizziness, headaches, light-headedness, numbness and seizures.  ?Psychiatric: Negative for behavioral problem, confusion, depressed mood and sleep disturbance.   ? ?   ?Objective:  ?Objective  ? ?Vitals:  ? 04/09/21 0955  ?BP: 104/70  ?Weight: 140 lb (63.5 kg)  ?Height: 5\' 3"  (1.6 m)  ? ?Body  mass index is 24.8 kg/m?. ? ?Physical Exam ?Vitals and nursing note reviewed. Exam conducted with a chaperone present.  ?Constitutional:   ?   Appearance: Normal appearance. She is well-developed.  ?HENT:  ?   Head: Normocephalic and atraumatic.  ?Eyes:  ?   Extraocular Movements: Extraocular movements intact.  ?   Pupils: Pupils are equal, round, and reactive to light.  ?Cardiovascular:  ?   Rate and Rhythm: Normal rate and regular rhythm.  ?Pulmonary:  ?   Effort: Pulmonary effort is normal. No respiratory distress.  ?   Breath sounds: Normal breath sounds.  ?Abdominal:  ?   General: Abdomen is  flat.  ?   Palpations: Abdomen is soft.  ?Genitourinary: ? ? ?Musculoskeletal:     ?   General: No signs of injury.  ?Skin: ?   General: Skin is warm and dry.  ?Neurological:  ?   Mental Status: She is alert and oriented to person, place, and time.  ?Psychiatric:     ?   Behavior: Behavior normal.     ?   Thought Content: Thought content normal.     ?   Judgment: Judgment normal.  ? ? ?Assessment/Plan:  ?  ? ?36 year old with left-sided Bartholin gland cyst. ?We will start Augmentin twice a day for 7 days ?Recommended continuing twice a day sitz bath's for 10 to 15 minutes. ?Plan follow-up early next week if no improvement can consider drainage. ? ?More than 10 minutes were spent face to face with the patient in the room, reviewing the medical record, labs and images, and coordinating care for the patient. The plan of management was discussed in detail and counseling was provided.  ?  ?Adelene Idler MD ?[facility], [facility] ?04/09/2021 ?10:59 AM ? ? ?

## 2021-04-09 NOTE — Patient Instructions (Signed)
How to Take a Sitz Bath A sitz bath is a warm water bath that may be used to care for your rectum, genital area, or the area between your rectum and genitals (perineum). In a sitz bath, the water only comes up to your hips and covers your buttocks. A sitz bath may be done in a bathtub or with a portable sitz baththat fits over the toilet. Your health care provider may recommend a sitz bath to help: Relieve pain and discomfort after delivering a baby. Relieve pain and itching from hemorrhoids or anal fissures. Relieve pain after certain surgeries. Relax muscles that are sore or tight. How to take a sitz bath Take 3-4 sitz baths a day, or as many as told by your health care provider. Bathtub sitz bath To take a sitz bath in a bathtub: Partially fill a bathtub with warm water. The water should be deep enough to cover your hips and buttocks when you are sitting in the tub. Follow your health care provider's instructions if you are told to put medicine in the water. Sit in the water. Open the tub drain a little, and leave it open during your bath. Turn on the warm water again, enough to replace the water that is draining out. Keep the water running throughout your bath. This helps keep the water at the right level and temperature. Soak in the water for 15-20 minutes, or as long as told by your health care provider. When you are done, be careful when you stand up. You may feel dizzy. After the sitz bath, pat yourself dry. Do not rub your skin to dry it.  Over-the-toilet sitz bath To take a sitz bath with an over-the-toilet basin: Follow the manufacturer's instructions. Fill the basin with warm water. Follow your health care provider's instructions if you were told to put medicine in the water. Sit on the seat. Make sure the water covers your buttocks and perineum. Soak in the water for 15-20 minutes, or as long as told by your health care provider. After the sitz bath, pat yourself dry. Do not  rub your skin to dry it. Clean and dry the basin between uses. Discard the basin if it cracks, or according to the manufacturer's instructions.  Contact a health care provider if: Your pain or itching gets worse. Do not continue with sitz baths if your symptoms get worse. You have new symptoms. Do not continue with sitz baths until you talk with your health care provider. Summary A sitz bath is a warm water bath in which the water only comes up to your hips and covers your buttocks. A sitz bath may help relieve pain and discomfort after delivering a baby. It also may help with pain and itching from hemorrhoids or anal fissures, or pain after certain surgeries. It can also help to relax muscles that are sore or tight. Take 3-4 sitz baths a day, or as many as told by your health care provider. Soak in the water for 15-20 minutes. Do not continue with sitz baths if your symptoms get worse. This information is not intended to replace advice given to you by your health care provider. Make sure you discuss any questions you have with your healthcare provider. Document Revised: 09/06/2019 Document Reviewed: 09/06/2019 Elsevier Patient Education  2022 Elsevier Inc.  

## 2021-04-14 ENCOUNTER — Ambulatory Visit: Payer: No Typology Code available for payment source | Admitting: Obstetrics and Gynecology

## 2021-05-18 ENCOUNTER — Encounter: Payer: Self-pay | Admitting: Family Medicine

## 2021-05-19 ENCOUNTER — Other Ambulatory Visit: Payer: Self-pay | Admitting: Family Medicine

## 2021-05-19 NOTE — Telephone Encounter (Signed)
Requested medication (s) are due for refill today: yes ? ?Requested medication (s) are on the active medication list: yes ? ?Last refill:  02/13/21 #90/0 ? ?Future visit scheduled: no ? ?Notes to clinic:  Unable to refill per protocol, cannot delegate. Pt is requesting to be sent to new pharmacy. ? ? ?  ?Requested Prescriptions  ?Pending Prescriptions Disp Refills  ? methylphenidate 36 MG PO CR tablet 90 tablet 0  ?  Sig: Take 1 tablet (36 mg total) by mouth daily.  ?  ? Not Delegated - Psychiatry:  Stimulants/ADHD Failed - 05/19/2021  4:07 PM  ?  ?  Failed - This refill cannot be delegated  ?  ?  Failed - Urine Drug Screen completed in last 360 days  ?  ?  Failed - Valid encounter within last 6 months  ?  Recent Outpatient Visits   ? ?      ? 5 months ago Attention deficit hyperactivity disorder (ADHD), unspecified ADHD type  ? [facility] [name], [name]  ? 11 months ago Attention deficit hyperactivity disorder (ADHD), unspecified ADHD type  ? [facility] Chrismon, Jodell Cipro, PA-C  ? 1 year ago Attention deficit hyperactivity disorder (ADHD), unspecified ADHD type  ? [facility] [place], [place], [place]  ? 1 year ago Attention deficit hyperactivity disorder (ADHD), unspecified ADHD type  ? [facility] [place], [place], [place]  ? 1 year ago Acute non-recurrent frontal sinusitis  ? [facility] [place], [place], [place]  ? ?  ?  ? ? ?  ?  ?  Passed - Last BP in normal range  ?  BP Readings from Last 1 Encounters:  ?04/09/21 104/70  ?   ?  ?  Passed - Last Heart Rate in normal range  ?  Pulse Readings from Last 1 Encounters:  ?12/16/20 66  ?   ?  ?  ? ?

## 2021-05-19 NOTE — Telephone Encounter (Signed)
Medication Refill - Medication:  ?methylphenidate 36 MG PO CR tablet ? ?Has the patient contacted their pharmacy? Yes.  Contact PCP- ? ?*see previous Mychart message if any other information is needed* ? ?Preferred Pharmacy (with phone number or street name):  ?[facility] Phone:  [phone]  ?Fax:  [phone]  ?  ? ? ?Has the patient been seen for an appointment in the last year OR does the patient have an upcoming appointment? Yes.   ? ?Agent: Please be advised that RX refills may take up to 3 business days. We ask that you follow-up with your pharmacy. ?

## 2021-05-20 ENCOUNTER — Other Ambulatory Visit: Payer: Self-pay | Admitting: Family Medicine

## 2021-05-20 ENCOUNTER — Other Ambulatory Visit: Payer: Self-pay

## 2021-05-20 ENCOUNTER — Other Ambulatory Visit (HOSPITAL_COMMUNITY): Payer: Self-pay

## 2021-05-20 DIAGNOSIS — R61 Generalized hyperhidrosis: Secondary | ICD-10-CM

## 2021-05-20 MED ORDER — METHYLPHENIDATE HCL ER (OSM) 27 MG PO TBCR
27.0000 mg | EXTENDED_RELEASE_TABLET | ORAL | 0 refills | Status: DC
  Filled 2021-05-20: qty 30, 30d supply, fill #0

## 2021-05-20 MED ORDER — METHYLPHENIDATE HCL ER (OSM) 36 MG PO TBCR
36.0000 mg | EXTENDED_RELEASE_TABLET | Freq: Every day | ORAL | 0 refills | Status: DC
  Filled 2021-05-20: qty 90, 90d supply, fill #0
  Filled 2021-05-20: qty 30, 30d supply, fill #0

## 2021-06-16 ENCOUNTER — Encounter: Payer: Self-pay | Admitting: Family Medicine

## 2021-06-17 ENCOUNTER — Other Ambulatory Visit: Payer: Self-pay | Admitting: Family Medicine

## 2021-06-17 MED ORDER — METHYLPHENIDATE HCL ER (OSM) 27 MG PO TBCR: 27.0000 mg | EXTENDED_RELEASE_TABLET | ORAL | 0 refills | Status: DC

## 2021-09-23 ENCOUNTER — Other Ambulatory Visit: Payer: Self-pay | Admitting: General Surgery

## 2021-09-23 ENCOUNTER — Ambulatory Visit: Payer: No Typology Code available for payment source | Admitting: Family Medicine

## 2021-09-23 DIAGNOSIS — N3000 Acute cystitis without hematuria: Secondary | ICD-10-CM

## 2021-09-23 MED ORDER — NITROFURANTOIN MACROCRYSTAL 100 MG PO CAPS: 100.0000 mg | ORAL_CAPSULE | Freq: Two times a day (BID) | ORAL | 0 refills | Status: DC

## 2021-09-23 NOTE — Progress Notes (Deleted)
      Established patient visit   Patient: Courtney Wyatt   DOB: 1985/11/10   36 y.o. Female  MRN: [id number] Visit Date: 09/23/2021  Today's healthcare provider: Gwyneth Sprout, [name]   No chief complaint on file.  Subjective    HPI  ***  Medications: Outpatient Medications Prior to Visit  Medication Sig   aluminum chloride (DRYSOL) 20 % external solution Apply topically at bedtime.   methylphenidate 27 MG PO CR tablet Take 1 tablet (27 mg total) by mouth every morning.   sertraline (ZOLOFT) 50 MG tablet Take 1 tablet (50 mg total) by mouth daily.   No facility-administered medications prior to visit.    Review of Systems  {Labs  Heme  Chem  Endocrine  Serology  Results Review (optional):23779}   Objective    There were no vitals taken for this visit. {Show previous vital signs (optional):23777}  Physical Exam  ***  No results found for any visits on 09/23/21.  Assessment & Plan     ***  No follow-ups on file.      {provider attestation***:1}   Gwyneth Sprout, [place] [phone] (phone) [phone] (fax)  [place]

## 2021-09-23 NOTE — Progress Notes (Signed)
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 2 days of urinary frequency, odor and discomfort.  Has used macrodantin successfully in the past. Usual duration is one week. RX sent.

## 2021-09-24 ENCOUNTER — Ambulatory Visit (INDEPENDENT_AMBULATORY_CARE_PROVIDER_SITE_OTHER): Payer: No Typology Code available for payment source | Admitting: Family Medicine

## 2021-09-24 ENCOUNTER — Encounter: Payer: Self-pay | Admitting: Family Medicine

## 2021-09-24 VITALS — BP 102/60 | HR 99 | Temp 97.7°F | Resp 16 | Wt 147.0 lb

## 2021-09-24 DIAGNOSIS — R300 Dysuria: Secondary | ICD-10-CM | POA: Diagnosis not present

## 2021-09-24 LAB — POCT URINALYSIS DIPSTICK
Bilirubin, UA: NEGATIVE
Glucose, UA: NEGATIVE
Ketones, UA: NEGATIVE
Nitrite, UA: NEGATIVE
Protein, UA: NEGATIVE
Spec Grav, UA: <=1.005 — AB (ref 1.010–1.025)
Urobilinogen, UA: 0.2 E.U./dL
pH, UA: 6 (ref 5.0–8.0)

## 2021-09-24 MED ORDER — PHENAZOPYRIDINE HCL 200 MG PO TABS: 200.0000 mg | ORAL_TABLET | Freq: Three times a day (TID) | ORAL | 0 refills | Status: DC | PRN

## 2021-09-24 NOTE — Assessment & Plan Note (Signed)
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

## 2021-09-24 NOTE — Progress Notes (Signed)
I,Sulibeya S Dimas,acting as a Education administrator for [employer], MD.,have documented all relevant documentation on the behalf of Eulis Foster, MD,as directed by  Eulis Foster, MD while in the presence of Eulis Foster, MD.    Established patient visit   Patient: Courtney Wyatt   DOB: 10/15/1985   36 y.o. Female  MRN: [id number] Visit Date: 09/24/2021  Today's healthcare provider: Eulis Foster, MD   Chief Complaint  Patient presents with   Urinary Tract Infection   Subjective    HPI  Urinary symptoms  She reports new onset dysuria, urinary frequency, and urinary urgency. The current episode started  3 days ago and is gradually worsening. Patient states symptoms are moderate in intensity, occurring constantly. She  has not been recently treated for similar symptoms. She states that her menses ended three days prior to today's appt.    Associated symptoms: No abdominal pain No back pain  No chills No constipation  Yes cramping No diarrhea  No discharge No fever  No hematuria No nausea  No vomiting    ---------------------------------------------------------------------------------------  Medications: Outpatient Medications Prior to Visit  Medication Sig   aluminum chloride (DRYSOL) 20 % external solution Apply topically at bedtime.   methylphenidate 27 MG PO CR tablet Take 1 tablet (27 mg total) by mouth every morning.   sertraline (ZOLOFT) 50 MG tablet Take 1 tablet (50 mg total) by mouth daily.   [DISCONTINUED] nitrofurantoin (MACRODANTIN) 100 MG capsule Take 1 capsule (100 mg total) by mouth 2 (two) times daily for 7 days. (Patient not taking: Reported on 09/24/2021)   No facility-administered medications prior to visit.    Review of Systems      Objective    BP 102/60 (BP Location: Left Arm, Patient Position: Sitting, Cuff Size: Large)   Pulse 99   Temp 97.7 F (36.5 C) (Oral)   Resp 16   Wt 147 lb (66.7 kg)   LMP  09/16/2021 (Approximate)   SpO2 99%   BMI 26.04 kg/m  BP Readings from Last 3 Encounters:  09/24/21 102/60  04/09/21 104/70  02/20/21 118/70   Wt Readings from Last 3 Encounters:  09/24/21 147 lb (66.7 kg)  04/09/21 140 lb (63.5 kg)  02/20/21 143 lb (64.9 kg)      Physical Exam  General: female appearing stated age in no acute distress Cardio: Normal S1 and S2, no S3 or S4. Rhythm is regular. No murmurs or rubs.  Bilateral radial pulses palpable Pulm: Clear to auscultation bilaterally, no crackles, wheezing, or diminished breath sounds. Normal respiratory effort Abdomen: Bowel sounds normal. Abdomen soft and non-tender. No suprapubic tenderness, no abdominal distention    Results for orders placed or performed in visit on 09/24/21  POCT urinalysis dipstick  Result Value Ref Range   Color, UA yellow    Clarity, UA clear    Glucose, UA Negative Negative   Bilirubin, UA Negative    Ketones, UA Negative    Spec Grav, UA <=1.005 (A) 1.010 - 1.025   Blood, UA Moderate    pH, UA 6.0 5.0 - 8.0   Protein, UA Negative Negative   Urobilinogen, UA 0.2 0.2 or 1.0 E.U./dL   Nitrite, UA Negative    Leukocytes, UA Small (1+) (A) Negative    Assessment & Plan     Problem List Items Addressed This Visit       Other   Dysuria - Primary    Acute 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 but has  not had issues as an adult  U/A notable for hgb, small leukoctyes  Will send for urine culture and microscopy  Pending results of culture, will prescribe abx as appropriate  Will prescribe pyridium for symptomatic relief 200mg  TID with meals, 10 tablets  Discussed return precautions including gross hematuria, lower abdominal pain, worsening dysuria, fever, chills or development of back pain, patient voiced understanding        Relevant Orders   POCT urinalysis dipstick (Completed)   Urine Culture   Microalbumin / creatinine urine ratio     Return if symptoms worsen or fail  to improve.     I, Eulis Foster, MD, have reviewed all documentation for this visit. The documentation on 09/24/21 for the exam, diagnosis, procedures, and orders are all accurate and complete.    Eulis Foster, MD  [facility] [phone] (phone) [phone] (fax)  [place]

## 2021-09-25 LAB — URINALYSIS, MICROSCOPIC ONLY
Bacteria, UA: NONE SEEN
Casts: NONE SEEN /lpf
RBC, Urine: NONE SEEN /hpf (ref 0–2)

## 2021-09-28 LAB — URINE CULTURE

## 2021-09-28 MED ORDER — CEPHALEXIN 500 MG PO CAPS: 500.0000 mg | ORAL_CAPSULE | Freq: Three times a day (TID) | ORAL | 0 refills | Status: AC

## 2021-09-28 NOTE — Addendum Note (Signed)
Addended by: Adolph Pollack on: 09/28/2021 12:52 PM   Modules accepted: Orders

## 2021-10-12 ENCOUNTER — Other Ambulatory Visit: Payer: Self-pay | Admitting: Family Medicine

## 2021-10-14 MED ORDER — METHYLPHENIDATE HCL ER (OSM) 27 MG PO TBCR: 27.0000 mg | EXTENDED_RELEASE_TABLET | ORAL | 0 refills | Status: DC

## 2022-01-15 ENCOUNTER — Other Ambulatory Visit: Payer: Self-pay | Admitting: Family Medicine

## 2022-01-18 MED ORDER — METHYLPHENIDATE HCL ER (OSM) 27 MG PO TBCR: 27.0000 mg | EXTENDED_RELEASE_TABLET | ORAL | 0 refills | Status: DC

## 2022-01-19 ENCOUNTER — Ambulatory Visit (INDEPENDENT_AMBULATORY_CARE_PROVIDER_SITE_OTHER): Payer: No Typology Code available for payment source | Admitting: Family Medicine

## 2022-01-19 ENCOUNTER — Other Ambulatory Visit (HOSPITAL_COMMUNITY)
Admission: RE | Admit: 2022-01-19 | Discharge: 2022-01-19 | Disposition: A | Discharge status: Home / Self Care | Payer: No Typology Code available for payment source | Source: Ambulatory Visit | Attending: Family Medicine | Admitting: Family Medicine

## 2022-01-19 ENCOUNTER — Encounter: Payer: Self-pay | Admitting: Family Medicine

## 2022-01-19 VITALS — BP 138/83 | HR 68 | Temp 97.5°F | Ht 63.0 in | Wt 145.6 lb

## 2022-01-19 DIAGNOSIS — F419 Anxiety disorder, unspecified: Secondary | ICD-10-CM | POA: Diagnosis not present

## 2022-01-19 DIAGNOSIS — R61 Generalized hyperhidrosis: Secondary | ICD-10-CM | POA: Diagnosis not present

## 2022-01-19 DIAGNOSIS — Z0000 Encounter for general adult medical examination without abnormal findings: Secondary | ICD-10-CM | POA: Diagnosis not present

## 2022-01-19 DIAGNOSIS — Z124 Encounter for screening for malignant neoplasm of cervix: Secondary | ICD-10-CM | POA: Insufficient documentation

## 2022-01-19 DIAGNOSIS — Z9151 Personal history of suicidal behavior: Secondary | ICD-10-CM

## 2022-01-19 MED ORDER — DRYSOL 20 % EX SOLN: Freq: Every day | CUTANEOUS | 3 refills | Status: DC

## 2022-01-19 MED ORDER — SERTRALINE HCL 50 MG PO TABS: 50.0000 mg | ORAL_TABLET | Freq: Every day | ORAL | 3 refills | Status: DC

## 2022-01-19 NOTE — Assessment & Plan Note (Signed)
PAP completed; negative hx Recommend HPV co test given age >35 years old

## 2022-01-19 NOTE — Assessment & Plan Note (Signed)
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  - Eat a low-fat diet with lots of fruits and vegetables, up to 7-9 servings per day.  - Seatbelts can save your life. Wear them always.  - Smoke detectors on every level of your home, check batteries every year.  - Eye Doctor - have an eye exam every 1-2 years  - Safe sex - if you may be exposed to STDs, use a condom.  - Alcohol -  If you drink, do it moderately, less than 2 drinks per day.  - [place]. Choose someone to speak for you if you are not able.  - Depression is common in our stressful world.If you're feeling down or losing interest in things you normally enjoy, please come in for a visit.  - Violence - If anyone is threatening or hurting you, please call immediately.

## 2022-01-19 NOTE — Progress Notes (Addendum)
I,[name],acting as a Neurosurgeon for Courtney Kindle, [name].,have documented all relevant documentation on the behalf of Courtney Kindle, [name],as directed by  Courtney Kindle, [name] while in the presence of Courtney Kindle, [name].   Complete physical exam  Patient: Courtney Wyatt   DOB: 1985/02/16   36 y.o. Female  MRN: [id number] Visit Date: 01/19/2022  Today's healthcare provider: [name], [name]  Re Introduced to nurse practitioner role and practice setting.  All questions answered.  Discussed provider/patient relationship and expectations.  Chief Complaint  Patient presents with   Annual Exam   Subjective    Martiza ZENAYA Wyatt is a 37 y.o. female who presents today for a complete physical exam.  She reports consuming a general diet. Gym/ health club routine includes cardio. She generally feels well. She reports sleeping well. She does not have additional problems to discuss today.   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  Colonel Bald   WISDOM TOOTH EXTRACTION     Social History   Socioeconomic History   Marital status: Married    Spouse name: Not on file   Number of children: 0   Years of education: Not on file   Highest education level: Not on file  Occupational History   Not on file  Tobacco Use   Smoking status: Former    Packs/day: 0.50    Years: 5.00     Total pack years: 2.50    Types: Cigarettes    Quit date: 10/21/2015    Years since quitting: 6.2   Smokeless tobacco: Never  Vaping Use   Vaping Use: Never used  Substance and Sexual Activity   Alcohol use: Not Currently   Drug use: No   Sexual activity: Yes    Birth control/protection: None  Other Topics Concern   Not on file  Social History Narrative   Not on file   Social Determinants of Health   Financial Resource Strain: Low Risk  (08/23/2017)   Overall Financial Resource Strain (CARDIA)    Difficulty of Paying Living Expenses: Not hard at all  Food Insecurity: No Food Insecurity (08/23/2017)   Hunger Vital Sign    Worried About Running Out of Food in the Last Year: Never true    Ran Out of Food in the Last Year: Never true  Transportation Needs: No Transportation Needs (08/23/2017)   PRAPARE - Administrator, Civil Service (Medical): No    Lack of Transportation (Non-Medical): No  Physical Activity: Inactive (08/23/2017)   Exercise Vital Sign    Days of Exercise per Week: 0 days    Minutes of Exercise per Session: 0 min  Stress: No Stress Concern Present (08/23/2017)   [employer] of Occupational Health - Occupational Stress Questionnaire  Feeling of Stress : Not at all  Social Connections: Unknown (08/24/2017)   Social Connection and Isolation Panel [NHANES]    Frequency of Communication with Friends and Family: Patient refused    Frequency of Social Gatherings with Friends and Family: Patient refused    Attends Religious Services: Patient refused    Active Member of Clubs or Organizations: Patient refused    Attends Archivist Meetings: Patient refused    Marital Status: Patient refused  Intimate Partner Violence: Not At Risk (08/23/2017)   Humiliation, Afraid, Rape, and Kick questionnaire    Fear of Current or Ex-Partner: No    Emotionally Abused: No    Physically Abused: No    Sexually Abused: No   Family Status  Relation Name  Status   Mother  Alive   Father  Alive   Mat Aunt  (Not Specified)   MGM  (Not Specified)   Family History  Problem Relation Age of Onset   Hyperlipidemia Mother    Depression Mother    Hypertension Mother    Hypertension Father    Diabetes Father    Breast cancer Maternal Aunt 42   Lung cancer Maternal Grandmother    No Known Allergies  Patient Care Team: Gwyneth Sprout, [name] as PCP - General (Family Medicine)   Medications: Outpatient Medications Prior to Visit  Medication Sig   methylphenidate 27 MG PO CR tablet Take 1 tablet (27 mg total) by mouth every morning.   [DISCONTINUED] aluminum chloride (DRYSOL) 20 % external solution Apply topically at bedtime.   [DISCONTINUED] sertraline (ZOLOFT) 50 MG tablet Take 1 tablet (50 mg total) by mouth daily.   [DISCONTINUED] phenazopyridine (PYRIDIUM) 200 MG tablet Take 1 tablet (200 mg total) by mouth 3 (three) times daily as needed for pain.   No facility-administered medications prior to visit.    Review of Systems  Objective    BP 138/83 (BP Location: Right Arm, Patient Position: Sitting, Cuff Size: Normal)   Pulse 68   Temp (!) 97.5 F (36.4 C) (Oral)   Ht 5\' 3"  (1.6 m)   Wt 145 lb 9.6 oz (66 kg)   SpO2 100%   BMI 25.79 kg/m   Physical Exam Vitals and nursing note reviewed.  Constitutional:      General: She is awake. She is not in acute distress.    Appearance: Normal appearance. She is well-developed, well-groomed and normal weight. She is not ill-appearing, toxic-appearing or diaphoretic.  HENT:     Head: Normocephalic and atraumatic.     Jaw: There is normal jaw occlusion. No trismus, tenderness, swelling or pain on movement.     Right Ear: Hearing, tympanic membrane, ear canal and external ear normal. There is no impacted cerumen.     Left Ear: Hearing, tympanic membrane, ear canal and external ear normal. There is no impacted cerumen.     Nose: Nose normal. No congestion or rhinorrhea.     Right Turbinates:  Not enlarged, swollen or pale.     Left Turbinates: Not enlarged, swollen or pale.     Right Sinus: No maxillary sinus tenderness or frontal sinus tenderness.     Left Sinus: No maxillary sinus tenderness or frontal sinus tenderness.     Mouth/Throat:     Lips: Pink.     Mouth: Mucous membranes are moist. No injury.     Tongue: No lesions.     Pharynx: Oropharynx is clear. Uvula midline. No pharyngeal swelling, oropharyngeal exudate, posterior oropharyngeal erythema  or uvula swelling.     Tonsils: No tonsillar exudate or tonsillar abscesses.  Eyes:     General: Lids are normal. Lids are everted, no foreign bodies appreciated. Vision grossly intact. Gaze aligned appropriately. No allergic shiner or visual field deficit.       Right eye: No discharge.        Left eye: No discharge.     Extraocular Movements: Extraocular movements intact.     Conjunctiva/sclera: Conjunctivae normal.     Right eye: Right conjunctiva is not injected. No exudate.    Left eye: Left conjunctiva is not injected. No exudate.    Pupils: Pupils are equal, round, and reactive to light.  Neck:     Thyroid: No thyroid mass, thyromegaly or thyroid tenderness.     Vascular: No carotid bruit.     Trachea: Trachea normal.  Cardiovascular:     Rate and Rhythm: Normal rate and regular rhythm.     Pulses: Normal pulses.          Carotid pulses are 2+ on the right side and 2+ on the left side.      Radial pulses are 2+ on the right side and 2+ on the left side.       Dorsalis pedis pulses are 2+ on the right side and 2+ on the left side.       Posterior tibial pulses are 2+ on the right side and 2+ on the left side.     Heart sounds: Normal heart sounds, S1 normal and S2 normal. No murmur heard.    No friction rub. No gallop.  Pulmonary:     Effort: Pulmonary effort is normal. No respiratory distress.     Breath sounds: Normal breath sounds and air entry. No stridor. No wheezing, rhonchi or rales.  Chest:     Chest  wall: No tenderness.  Abdominal:     General: Abdomen is flat. Bowel sounds are normal. There is no distension.     Palpations: Abdomen is soft. There is no mass.     Tenderness: There is no abdominal tenderness. There is no right CVA tenderness, left CVA tenderness, guarding or rebound.     Hernia: No hernia is present. There is no hernia in the left inguinal area or right inguinal area.  Genitourinary:    General: Normal vulva.     Exam position: Lithotomy position.     Pubic Area: No rash or pubic lice.      Tanner stage (genital): 5.     Labia:        Right: No rash, tenderness, lesion or injury.        Left: No rash, tenderness, lesion or injury.      Vagina: Vaginal discharge present.     Cervix: Normal.     Uterus: Normal.      Adnexa: Right adnexa normal and left adnexa normal.     Comments: Offered chaperone; pt declined   Musculoskeletal:        General: No swelling, tenderness, deformity or signs of injury. Normal range of motion.     Cervical back: Full passive range of motion without pain, normal range of motion and neck supple. No edema, rigidity or tenderness. No muscular tenderness.     Right lower leg: No edema.     Left lower leg: No edema.  Lymphadenopathy:     Cervical: No cervical adenopathy.     Right cervical: No superficial, deep or posterior cervical adenopathy.  Left cervical: No superficial, deep or posterior cervical adenopathy.  Skin:    General: Skin is warm and dry.     Capillary Refill: Capillary refill takes less than 2 seconds.     Coloration: Skin is not jaundiced or pale.     Findings: No bruising, erythema, lesion or rash.  Neurological:     General: No focal deficit present.     Mental Status: She is alert and oriented to person, place, and time. Mental status is at baseline.     GCS: GCS eye subscore is 4. GCS verbal subscore is 5. GCS motor subscore is 6.     Sensory: Sensation is intact. No sensory deficit.     Motor: Motor function is  intact. No weakness.     Coordination: Coordination is intact. Coordination normal.     Gait: Gait is intact. Gait normal.  Psychiatric:        Attention and Perception: Attention and perception normal.        Mood and Affect: Mood and affect normal.        Speech: Speech normal.        Behavior: Behavior normal. Behavior is cooperative.        Thought Content: Thought content normal.        Cognition and Memory: Cognition and memory normal.        Judgment: Judgment normal.       Last depression screening scores    09/24/2021    1:45 PM 12/16/2020    3:37 PM 10/31/2019    9:42 AM  PHQ 2/9 Scores  PHQ - 2 Score 0 0 0  PHQ- 9 Score 0 1 0   Last fall risk screening    09/24/2021    1:45 PM  Fall Risk   Falls in the past year? 0  Number falls in past yr: 0  Injury with Fall? 0  Risk for fall due to : No Fall Risks  Follow up Falls evaluation completed   Last Audit-C alcohol use screening    09/24/2021    1:45 PM  Alcohol Use Disorder Test (AUDIT)  1. How often do you have a drink containing alcohol? 2  2. How many drinks containing alcohol do you have on a typical day when you are drinking? 0  3. How often do you have six or more drinks on one occasion? 0  AUDIT-C Score 2   A score of 3 or more in women, and 4 or more in men indicates increased risk for alcohol abuse, EXCEPT if all of the points are from question 1   No results found for any visits on 01/19/22.  Assessment & Plan    Routine Health Maintenance and Physical Exam  Exercise Activities and Dietary recommendations  Goals   None     Immunization History  Administered Date(s) Administered   Influenza Inj Mdck Quad Pf 10/28/2021   Influenza,inj,Quad PF,6+ Mos 10/25/2018   Influenza-Unspecified 10/03/2016, 11/09/2017   PPD Test 08/27/2013, 07/23/2014   Tdap 07/14/2009, 04/17/2019    Health Maintenance  Topic Date Due   Hepatitis C Screening  Never done   PAP SMEAR-Modifier  06/15/2019    COVID-19 Vaccine (1) 02/04/2022 (Originally 07/25/1985)   DTaP/Tdap/Td (3 - Td or Tdap) 04/16/2029   INFLUENZA VACCINE  Completed   HIV Screening  Completed   HPV VACCINES  Aged Out    Discussed health benefits of physical activity, and encouraged her to engage in regular exercise appropriate for  her age and condition.  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  - Eat a low-fat diet with lots of fruits and vegetables, up to 7-9 servings per day.  - Seatbelts can save your life. Wear them always.  - Smoke detectors on every level of your home, check batteries every year.  - Eye Doctor - have an eye exam every 1-2 years  - Safe sex - if you may be exposed to STDs, use a condom.  - Alcohol -  If you drink, do it moderately, less than 2 drinks per day.  - [place]. Choose someone to speak for you if you are not able.  - Depression is common in our stressful world.If you're feeling down or losing interest in things you normally enjoy, please come in for a visit.  - Violence - If anyone is threatening or hurting you, please call immediately.       Anxiety    Chronic, stable Continue zoloft at 50 mg       Relevant Medications   sertraline (ZOLOFT) 50 MG tablet   Cervical cancer screening    PAP completed; negative hx Recommend HPV co test given age >4 years old      Relevant Orders   Cytology - PAP   Excessive sweating    Chronic, stable Request for additional deodorant to assist      Relevant Medications   aluminum chloride (DRYSOL) 20 % external solution   H/O suicide attempt    Remote history; no active concerns. 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  Parents help with childcare. Marriage solid- 1 date night/week. Sex life satisfactory. Contracted for safety.      Return in about 6 months (around 07/20/2022) for chonic disease management- ADD combined .    Vonna Kotyk, [name], have reviewed all documentation for this visit. The documentation on 01/19/22 for the exam, diagnosis, procedures, and orders are all accurate and complete.  Gwyneth Sprout, [place] [phone] (phone) [phone] (fax)  [place]

## 2022-01-19 NOTE — Assessment & Plan Note (Signed)
Chronic, stable Continue zoloft at 50 mg  

## 2022-01-19 NOTE — Assessment & Plan Note (Signed)
Chronic, stable Request for additional deodorant to assist

## 2022-01-19 NOTE — Assessment & Plan Note (Signed)
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 Parents help with childcare. Marriage solid- 1 date night/week. Sex life satisfactory. Contracted for safety.

## 2022-01-28 LAB — CYTOLOGY - PAP
Chlamydia: NEGATIVE
Comment: NEGATIVE
Comment: NEGATIVE
Comment: NEGATIVE
Comment: NORMAL
Diagnosis: NEGATIVE
HSV1: NEGATIVE
HSV2: NEGATIVE
Neisseria Gonorrhea: NEGATIVE
Trichomonas: NEGATIVE

## 2022-03-30 LAB — BASIC METABOLIC PANEL
BUN: 18 (ref 4–21)
CO2: 23 — AB (ref 13–22)
Chloride: 102 (ref 99–108)
Creatinine: 0.8 (ref 0.5–1.1)
Glucose: 86
Potassium: 4.5 mEq/L (ref 3.5–5.1)
Sodium: 139 (ref 137–147)

## 2022-03-30 LAB — HEMOGLOBIN A1C: Hemoglobin A1C: 5.2

## 2022-03-30 LAB — CBC AND DIFFERENTIAL
HCT: 40 (ref 36–46)
Hemoglobin: 13.2 (ref 12.0–16.0)
Neutrophils Absolute: 2.9
Platelets: 359 10*3/uL (ref 150–400)
WBC: 6.3

## 2022-03-30 LAB — LIPID PANEL
Cholesterol: 160 (ref 0–200)
HDL: 64 (ref 35–70)
LDL Cholesterol: 85

## 2022-03-30 LAB — COMPREHENSIVE METABOLIC PANEL
Albumin: 4.7 (ref 3.5–5.0)
Calcium: 9.9 (ref 8.7–10.7)
Globulin: 2.3
eGFR: 94

## 2022-03-30 LAB — TSH: TSH: 1.43 (ref 0.41–5.90)

## 2022-03-30 LAB — CBC: RBC: 4.47 (ref 3.87–5.11)

## 2022-03-31 LAB — LIPID PANEL: Triglycerides: 54 (ref 40–160)

## 2022-04-23 ENCOUNTER — Other Ambulatory Visit: Payer: Self-pay | Admitting: Family Medicine

## 2022-04-27 ENCOUNTER — Other Ambulatory Visit: Payer: Self-pay | Admitting: Family Medicine

## 2022-04-27 MED ORDER — METHYLPHENIDATE HCL ER (OSM) 27 MG PO TBCR: 27.0000 mg | EXTENDED_RELEASE_TABLET | ORAL | 0 refills | Status: DC

## 2022-04-28 MED ORDER — METHYLPHENIDATE HCL ER (OSM) 27 MG PO TBCR: 27.0000 mg | EXTENDED_RELEASE_TABLET | ORAL | 0 refills | Status: DC

## 2022-04-28 NOTE — Telephone Encounter (Signed)
See Rx request ° °

## 2022-07-22 ENCOUNTER — Encounter: Payer: Self-pay | Admitting: Family Medicine

## 2022-07-22 ENCOUNTER — Ambulatory Visit: Payer: No Typology Code available for payment source | Admitting: Family Medicine

## 2022-07-22 VITALS — BP 122/79 | HR 62 | Wt 150.9 lb

## 2022-07-22 DIAGNOSIS — M542 Cervicalgia: Secondary | ICD-10-CM | POA: Diagnosis not present

## 2022-07-22 DIAGNOSIS — G4440 Drug-induced headache, not elsewhere classified, not intractable: Secondary | ICD-10-CM | POA: Insufficient documentation

## 2022-07-22 DIAGNOSIS — F39 Unspecified mood [affective] disorder: Secondary | ICD-10-CM

## 2022-07-22 DIAGNOSIS — K2100 Gastro-esophageal reflux disease with esophagitis, without bleeding: Secondary | ICD-10-CM | POA: Diagnosis not present

## 2022-07-22 MED ORDER — BUTALBITAL-APAP-CAFFEINE 50-325-40 MG PO TABS
1.0000 | ORAL_TABLET | Freq: Four times a day (QID) | ORAL | 0 refills | Status: DC | PRN
Start: 2022-07-22 — End: 2022-11-25

## 2022-07-22 MED ORDER — TIZANIDINE HCL 4 MG PO TABS
4.0000 mg | ORAL_TABLET | Freq: Four times a day (QID) | ORAL | 0 refills | Status: DC | PRN
Start: 2022-07-22 — End: 2022-11-25

## 2022-07-22 MED ORDER — SUCRALFATE 1 G PO TABS: 1.0000 g | ORAL_TABLET | Freq: Three times a day (TID) | ORAL | 0 refills | Status: DC

## 2022-07-22 MED ORDER — SERTRALINE HCL 100 MG PO TABS
100.0000 mg | ORAL_TABLET | Freq: Every day | ORAL | 3 refills | Status: DC
Start: 2022-07-22 — End: 2023-06-13

## 2022-07-22 NOTE — Assessment & Plan Note (Signed)
Chronic; ongoing Will increase zoloft to assist anxiety/stress mgmt F/u as needed

## 2022-07-22 NOTE — Progress Notes (Signed)
Established patient visit   Patient: Courtney Wyatt   DOB: 20-Oct-1985   37 y.o. Female  MRN: [id number] Visit Date: 07/22/2022  Today's healthcare provider: [name], [name]  Introduced to nurse practitioner role and practice setting.  All questions answered.  Discussed provider/patient relationship and expectations.  Chief Complaint  Patient presents with   Migraine    Patient says she has been experiencing a severe lingering headache. Patient says last week, she started having a stomach pain and was informed it may be caused by NSAID use. Patient says that same week, she started having headaches and gradually got worse as the days went by. Patient has tried Tylenol, Ibuprofen and Toradol and says nothing seems to help it. Patient says she was given a Migraine cocktail in ED and said it help got from pain level of 10 to down to a 3 or 4 and it has just stayed there.   Dizziness   Subjective    Migraine  Associated symptoms include dizziness.  Dizziness   HPI     Migraine    Additional comments: Patient says she has been experiencing a severe lingering headache. Patient says last week, she started having a stomach pain and was informed it may be caused by NSAID use. Patient says that same week, she started having headaches and gradually got worse as the days went by. Patient has tried Tylenol, Ibuprofen and Toradol and says nothing seems to help it. Patient says she was given a Migraine cocktail in ED and said it help got from pain level of 10 to down to a 3 or 4 and it has just stayed there.        Comments   Patient says she has not taken her Concerta prescription since Friday, as the medication can tend to make her dizziness worse.      Last edited by Malen Gauze, CMA on 07/22/2022  2:46 PM.      Medications: Outpatient Medications Prior to Visit  Medication Sig   aluminum chloride (DRYSOL) 20 % external solution Apply topically at bedtime.   methylphenidate 27  MG PO CR tablet Take 1 tablet (27 mg total) by mouth every morning.   methylphenidate 27 MG PO CR tablet Take 1 tablet (27 mg total) by mouth every morning.   [DISCONTINUED] sertraline (ZOLOFT) 50 MG tablet Take 1 tablet (50 mg total) by mouth daily.   No facility-administered medications prior to visit.     Objective    BP 122/79   Pulse 62   Wt 150 lb 14.4 oz (68.4 kg)   SpO2 100%   BMI 26.73 kg/m   Physical Exam Vitals and nursing note reviewed.  Constitutional:      General: She is not in acute distress.    Appearance: Normal appearance. She is overweight. She is not ill-appearing, toxic-appearing or diaphoretic.  HENT:     Head: Normocephalic and atraumatic.  Cardiovascular:     Rate and Rhythm: Normal rate and regular rhythm.     Pulses: Normal pulses.     Heart sounds: Normal heart sounds. No murmur heard.    No friction rub. No gallop.  Pulmonary:     Effort: Pulmonary effort is normal. No respiratory distress.     Breath sounds: Normal breath sounds. No stridor. No wheezing, rhonchi or rales.  Chest:     Chest wall: No tenderness.  Abdominal:     General: Bowel sounds are normal.  Palpations: Abdomen is soft.  Musculoskeletal:        General: No swelling, tenderness, deformity or signs of injury. Normal range of motion.     Cervical back: Spinous process tenderness and muscular tenderness present.     Right lower leg: No edema.     Left lower leg: No edema.  Skin:    General: Skin is warm and dry.     Capillary Refill: Capillary refill takes less than 2 seconds.     Coloration: Skin is not jaundiced or pale.     Findings: No bruising, erythema, lesion or rash.  Neurological:     General: No focal deficit present.     Mental Status: She is alert and oriented to person, place, and time. Mental status is at baseline.     Cranial Nerves: No cranial nerve deficit.     Sensory: No sensory deficit.     Motor: No weakness.     Coordination: Coordination normal.   Psychiatric:        Mood and Affect: Mood normal.        Behavior: Behavior normal.        Thought Content: Thought content normal.        Judgment: Judgment normal.     Results for orders placed or performed in visit on 07/22/22  CBC and differential  Result Value Ref Range   Hemoglobin 13.2 12.0 - 16.0   HCT 40 36 - 46   Neutrophils Absolute 2.90    Platelets 359 150 - 400 K/uL   WBC 6.3   CBC  Result Value Ref Range   RBC 4.47 3.87 - 5.11  Basic metabolic panel  Result Value Ref Range   Glucose 86    BUN 18 4 - 21   CO2 23 (A) 13 - 22   Creatinine 0.8 0.5 - 1.1   Potassium 4.5 3.5 - 5.1 mEq/L   Sodium 139 137 - 147   Chloride 102 99 - 108  Comprehensive metabolic panel  Result Value Ref Range   Globulin 2.3    eGFR 94    Calcium 9.9 8.7 - 10.7   Albumin 4.7 3.5 - 5.0  Lipid panel  Result Value Ref Range   Triglycerides 54 40 - 160   Cholesterol 160 0 - 200   HDL 64 35 - 70   LDL Cholesterol 85   Hemoglobin A1c  Result Value Ref Range   Hemoglobin A1C 5.2   TSH  Result Value Ref Range   TSH 1.43 0.41 - 5.90    Assessment & Plan     Problem List Items Addressed This Visit       Digestive   Gastroesophageal reflux disease with esophagitis without hemorrhage    Acute on chronic, feels 'ball' like pressure Recommend carafate to assist F/u as needed       Relevant Medications   sucralfate (CARAFATE) 1 g tablet     Other   Mood disorder (HCC)    Chronic; ongoing Will increase zoloft to assist anxiety/stress mgmt F/u as needed       Relevant Medications   sertraline (ZOLOFT) 100 MG tablet   Neck pain without injury    Tension across back shoulder; palpable Recommend ice, stretching, massage, use of muscle relaxant to assist       Relevant Medications   tiZANidine (ZANAFLEX) 4 MG tablet   Rebound headache - Primary    Daily use of NSAIDs 600 mg or more Last used  7/15 Sought ER visit given ongoing headache; received zofran to  assist Working to ID triggers Samples of migraine meds provided as well as Rx migraine med      Relevant Medications   butalbital-acetaminophen-caffeine (FIORICET) 50-325-40 MG tablet   tiZANidine (ZANAFLEX) 4 MG tablet   sertraline (ZOLOFT) 100 MG tablet   Return if symptoms worsen or fail to improve.     Leilani Merl, [name], have reviewed all documentation for this visit. The documentation on 07/22/22 for the exam, diagnosis, procedures, and orders are all accurate and complete.  [name], [name]  [facility] [phone] (phone) [phone] (fax)  [facility]

## 2022-07-22 NOTE — Assessment & Plan Note (Signed)
Daily use of NSAIDs 600 mg or more Last used 7/15 Sought ER visit given ongoing headache; received zofran to assist Working to ID triggers Samples of migraine meds provided as well as Rx migraine med

## 2022-07-22 NOTE — Assessment & Plan Note (Signed)
Tension across back shoulder; palpable Recommend ice, stretching, massage, use of muscle relaxant to assist

## 2022-07-22 NOTE — Assessment & Plan Note (Signed)
Acute on chronic, feels 'ball' like pressure Recommend carafate to assist F/u as needed

## 2022-07-23 ENCOUNTER — Ambulatory Visit: Payer: No Typology Code available for payment source | Admitting: Family Medicine

## 2022-07-27 ENCOUNTER — Encounter: Payer: Self-pay | Admitting: Family Medicine

## 2022-07-27 ENCOUNTER — Telehealth: Payer: Self-pay

## 2022-07-27 ENCOUNTER — Ambulatory Visit
Admission: RE | Admit: 2022-07-27 | Discharge: 2022-07-27 | Disposition: A | Discharge status: Home / Self Care | Payer: No Typology Code available for payment source | Source: Ambulatory Visit | Attending: Family Medicine | Admitting: Family Medicine

## 2022-07-27 ENCOUNTER — Other Ambulatory Visit: Payer: Self-pay | Admitting: Family Medicine

## 2022-07-27 DIAGNOSIS — G4452 New daily persistent headache (NDPH): Secondary | ICD-10-CM | POA: Insufficient documentation

## 2022-07-27 MED ORDER — DIAZEPAM 5 MG PO TABS: ORAL_TABLET | ORAL | 0 refills | Status: DC

## 2022-07-27 MED ORDER — IOHEXOL 300 MG/ML  SOLN
75.0000 mL | Freq: Once | INTRAMUSCULAR | Status: AC | PRN
  Administered 2022-07-27: 75 mL via INTRAVENOUS

## 2022-07-27 NOTE — Telephone Encounter (Unsigned)
Copied from CRM [id number]. Topic: General - Other >> Jul 27, 2022  1:32 PM [name] wrote: Reason for CRM:  PLease call patient, she stated in the recent Mychart message Courtney Wyatt said "MRI".  Should I be having an CT or MRI. PLease advise.

## 2022-07-27 NOTE — Progress Notes (Signed)
Normal CT reading; reassuring despite symptoms

## 2022-08-02 NOTE — Progress Notes (Unsigned)
Referring:  [name], [name] [address] [place],  [place] [address]  PCP: [name], [name]  Neurology was asked to evaluate Courtney Wyatt, a 37 year old female for a chief complaint of headaches.  Our recommendations of care will be communicated by shared medical record.    CC:  headaches  History provided from self  HPI:  Medical co-morbidities: chronic interstitial cystitis  The patient presents for evaluation of headaches. She has a history of intermittent migraine with visual aura, but generally does not have a severe headache with this. In the past year she has developed daily headaches. She was taking ibuprofen near daily for the past year due to these headaches. However, she developed a gastric ulcer from ibuprofen overuse and can no longer take NSAIDs.   She stopped taking NSAIDs and developed a severe migraine with photophobia, phonophobia, and nausea. She presented to the ED for this on 07/18/21. Migraine resolved after 24 hours, but she continues to have a constant nagging headache. The pain starts in bilateral occiput/neck and radiates forward. She denies pain or numbness in her upper extremities. Headache is worsened by exertion and intercourse. She takes Tylenol as needed 3-4 days per week which reduces the headache but does not resolve it. Has also taken tizanidine which does help somewhat.  [facility] 07/27/22 was unremarkable.  Headache History: Onset: 1 year ago Triggers: exertion, intercourse Aura: scotoma Location: bilateral neck, occiput radiating forward Quality/Description: throbbing Associated Symptoms:  Photophobia: yes  Phonophobia: yes  Nausea: yes Worse with activity?: Duration of headaches: 24 hours  Headache days per month: 30 Migraine days per month: 1 Headache free days per month: 0  Current Treatment: Abortive Tylenol  Preventative none  Prior Therapies                                  Tylenol ibuprofen Toradol Fioricet tizanidine Zoloft   LABS: CBC    Component Value Date/Time   WBC 6.3 03/30/2022 0000   WBC 19.8 (H) 06/22/2019 0641   RBC 4.47 03/30/2022 0000   HGB 13.2 03/30/2022 0000   HGB 14.5 06/25/2020 0849   HCT 40 03/30/2022 0000   HCT 42.8 06/25/2020 0849   PLT 359 03/30/2022 0000   PLT 333 06/25/2020 0849   MCV 87 06/25/2020 0849   MCH 29.6 06/25/2020 0849   MCH 28.4 06/22/2019 0641   MCHC 33.9 06/25/2020 0849   MCHC 34.0 06/22/2019 0641   RDW 11.9 06/25/2020 0849   LYMPHSABS 2.3 06/25/2020 0849   EOSABS 0.1 06/25/2020 0849   BASOSABS 0.1 06/25/2020 0849      Latest Ref Rng & Units 03/30/2022   12:00 AM 06/25/2020    8:49 AM 03/15/2015    1:43 PM  CMP  Glucose 65 - 99 mg/dL  76  604   BUN 4 - 21 18     16  15    Creatinine 0.5 - 1.1 0.8     0.72  0.80   Sodium 137 - 147 139     140  140   Potassium 3.5 - 5.1 mEq/L 4.5     4.8  4.1   Chloride 99 - 108 102     104  109   CO2 13 - 22 23     18  19    Calcium 8.7 - 10.7 9.9     9.8  10.1   Total Protein 6.0 -  8.5 g/dL  7.6  8.2   Total Bilirubin 0.0 - 1.2 mg/dL  0.6  1.1   Alkaline Phos 44 - 121 IU/L  63  56   AST 0 - 40 IU/L  22  21   ALT 0 - 32 IU/L  19  18      This result is from an external source.     IMAGING:  [facility] 07/27/22: no acute abnormality  Imaging independently reviewed on August 03, 2022   Current Outpatient Medications on File Prior to Visit  Medication Sig Dispense Refill   aluminum chloride (DRYSOL) 20 % external solution Apply topically at bedtime. 35 mL 3   butalbital-acetaminophen-caffeine (FIORICET) 50-325-40 MG tablet Take 1-2 tablets by mouth every 6 (six) hours as needed for headache. 20 tablet 0   methylphenidate 27 MG PO CR tablet Take 1 tablet (27 mg total) by mouth every morning. 90 tablet 0   methylphenidate 27 MG PO CR tablet Take 1 tablet (27 mg total) by mouth every morning. 90 tablet 0   sertraline (ZOLOFT) 100 MG tablet Take 1 tablet (100 mg total)  by mouth daily. 100 tablet 3   tiZANidine (ZANAFLEX) 4 MG tablet Take 1 tablet (4 mg total) by mouth every 6 (six) hours as needed for muscle spasms. 30 tablet 0   diazepam (VALIUM) 5 MG tablet Use before MRI 2 tablet 0   sucralfate (CARAFATE) 1 g tablet Take 1 tablet (1 g total) by mouth 4 (four) times daily -  with meals and at bedtime. 120 tablet 0   No current facility-administered medications on file prior to visit.     Allergies: No Known Allergies  Family History: Family History  Problem Relation Age of Onset   Hyperlipidemia Mother    Depression Mother    Hypertension Mother    Hypertension Father    Diabetes Father    Breast cancer Maternal Aunt 59   Lung cancer Maternal Grandmother     Past Medical History: 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   History of Papanicolaou smear of cervix 07/10/2013; 10/21/2014   NEG; NEG   Hyperhidrosis 04/18/2013   Interstitial cystitis    PONV (postoperative nausea and vomiting)    SAB (spontaneous abortion) 07/14/2016    Past Surgical History Past Surgical History:  Procedure Laterality Date   CESAREAN SECTION N/A 08/25/2017   Procedure: CESAREAN SECTION;  Surgeon: Richarda Overlie, MD;  Location: [facility];  Service: Obstetrics;  Laterality: N/A;   LAPAROSCOPY  04/2004   TONSILLECTOMY  1994   DR.  Colonel Bald   WISDOM TOOTH EXTRACTION      Social History: Social History   Tobacco Use   Smoking status: Former    Current packs/day: 0.00    Average packs/day: 0.5 packs/day for 5.0 years (2.5 ttl pk-yrs)    Types: Cigarettes    Start date: 10/21/2010    Quit date: 10/21/2015    Years since quitting: 6.7   Smokeless tobacco: Never  Vaping Use   Vaping status: Never Used  Substance Use Topics   Alcohol use: Not Currently    Comment: occ   Drug use: No    ROS: Negative for  fevers, chills. Positive for headaches. All other systems reviewed and negative unless stated otherwise in HPI.   Physical Exam:  Vital Signs: BP 117/75 (BP Location: Left Arm, Patient Position: Sitting, Cuff Size: Normal)   Pulse 70   Ht 5\' 3"  (1.6 m)   Wt 147 lb 9.6 oz (67 kg)   BMI 26.15 kg/m  GENERAL: well appearing,in no acute distress,alert SKIN:  Color, texture, turgor normal. No rashes or lesions HEAD:  Normocephalic/atraumatic. CV:  RRR RESP: Normal respiratory effort MSK: no tenderness to palpation over occiput, neck, or shoulders  NEUROLOGICAL: Mental Status: Alert, oriented to person, place and time,Follows commands Cranial Nerves: PERRL, visual fields intact to confrontation, extraocular movements intact, facial sensation intact, no facial droop or ptosis, hearing grossly intact, no dysarthria Motor: muscle strength 5/5 both upper and lower extremities Reflexes: 2+ throughout Sensation: intact to light touch all 4 extremities Coordination: Finger-to- nose-finger intact bilaterally Gait: normal-based   IMPRESSION: 37 year old female with a history of interstitial cystitis who presents for evaluation of worsening daily headaches for the past year. Will order MRI brain and CTA head as she reports an exertional component and exacerbation of headache with intercourse. Will start Topamax for headache prevention. If CTA is normal will plan to start triptan for migraine rescue.  PLAN: -MRI brain, CTA head -Prevention: Start Topamax for headache prevention. Take 25 mg (1 pill) at bedtime for one week, then increase to 50 mg (2 pills) at bedtime for one week, then take 75 mg (3 pills) at bedtime for one week, then take 100 mg (4 pills) at bedtime -Rescue: Continue Tylenol and tizanidine for now. Will plan to start triptan if CTA is normal -Referral to neck PT for neck pain   I spent a total of 30 minutes chart reviewing and counseling the patient. Headache education was  done. Discussed treatment options including preventive and acute medications. Discussed medication overuse headache and to limit use of acute treatments to no more than 2 days/week or 10 days/month. Discussed medication side effects, adverse reactions and drug interactions. Written educational materials and patient instructions outlining all of the above were given.  Follow-up: 4 months   Ocie Doyne, MD 08/03/2022   10:49 AM

## 2022-08-03 ENCOUNTER — Telehealth: Payer: Self-pay | Admitting: Psychiatry

## 2022-08-03 ENCOUNTER — Encounter: Payer: Self-pay | Admitting: Psychiatry

## 2022-08-03 ENCOUNTER — Ambulatory Visit (INDEPENDENT_AMBULATORY_CARE_PROVIDER_SITE_OTHER): Payer: No Typology Code available for payment source | Admitting: Psychiatry

## 2022-08-03 VITALS — BP 117/75 | HR 70 | Ht 63.0 in | Wt 147.6 lb

## 2022-08-03 DIAGNOSIS — G4484 Primary exertional headache: Secondary | ICD-10-CM | POA: Diagnosis not present

## 2022-08-03 DIAGNOSIS — G43101 Migraine with aura, not intractable, with status migrainosus: Secondary | ICD-10-CM

## 2022-08-03 DIAGNOSIS — M542 Cervicalgia: Secondary | ICD-10-CM

## 2022-08-03 MED ORDER — TOPIRAMATE 25 MG PO TABS: ORAL_TABLET | ORAL | 6 refills | Status: DC

## 2022-08-03 NOTE — Telephone Encounter (Signed)
sent to GI they obtain [employer] auth [id number] 

## 2022-08-13 ENCOUNTER — Encounter: Payer: Self-pay | Admitting: Psychiatry

## 2022-08-18 ENCOUNTER — Other Ambulatory Visit: Payer: Self-pay | Admitting: Family Medicine

## 2022-08-18 ENCOUNTER — Encounter: Payer: Self-pay | Admitting: Family Medicine

## 2022-08-18 MED ORDER — METHYLPHENIDATE HCL ER (OSM) 27 MG PO TBCR: 27.0000 mg | EXTENDED_RELEASE_TABLET | ORAL | 0 refills | Status: DC

## 2022-08-20 ENCOUNTER — Ambulatory Visit
Admission: RE | Admit: 2022-08-20 | Discharge: 2022-08-20 | Disposition: A | Discharge status: Home / Self Care | Payer: No Typology Code available for payment source | Source: Ambulatory Visit | Attending: Psychiatry | Admitting: Psychiatry

## 2022-08-20 DIAGNOSIS — G4484 Primary exertional headache: Secondary | ICD-10-CM | POA: Diagnosis not present

## 2022-08-20 MED ORDER — GADOPICLENOL 0.5 MMOL/ML IV SOLN
7.5000 mL | Freq: Once | INTRAVENOUS | Status: AC | PRN
  Administered 2022-08-20: 7 mL via INTRAVENOUS

## 2022-08-20 MED ORDER — IOPAMIDOL (ISOVUE-370) INJECTION 76%
75.0000 mL | Freq: Once | INTRAVENOUS | Status: AC | PRN
  Administered 2022-08-20: 75 mL via INTRAVENOUS

## 2022-08-25 ENCOUNTER — Encounter: Payer: Self-pay | Admitting: Psychiatry

## 2022-09-01 NOTE — Telephone Encounter (Signed)
It has been a surprisingly long time for the CTA to not have been read. Could we reach out to the imaging center to see if there is some reason for the delay?

## 2022-09-07 ENCOUNTER — Other Ambulatory Visit: Payer: Self-pay | Admitting: Psychiatry

## 2022-09-07 MED ORDER — RIZATRIPTAN BENZOATE 10 MG PO TABS: 10.0000 mg | ORAL_TABLET | ORAL | 6 refills | Status: DC | PRN

## 2022-10-11 ENCOUNTER — Encounter: Payer: Self-pay | Admitting: Family Medicine

## 2022-10-13 ENCOUNTER — Other Ambulatory Visit: Payer: Self-pay | Admitting: Otolaryngology

## 2022-10-14 ENCOUNTER — Other Ambulatory Visit: Payer: Self-pay

## 2022-10-14 ENCOUNTER — Encounter: Payer: Self-pay | Admitting: Neurology

## 2022-10-15 MED ORDER — TOPIRAMATE 100 MG PO TABS: 100.0000 mg | ORAL_TABLET | Freq: Every evening | ORAL | 3 refills | Status: DC

## 2022-10-20 ENCOUNTER — Encounter: Payer: Self-pay | Admitting: Otolaryngology

## 2022-10-21 ENCOUNTER — Encounter: Payer: Self-pay | Admitting: Otolaryngology

## 2022-10-22 ENCOUNTER — Ambulatory Visit: Payer: No Typology Code available for payment source | Admitting: Gastroenterology

## 2022-10-25 ENCOUNTER — Ambulatory Visit (INDEPENDENT_AMBULATORY_CARE_PROVIDER_SITE_OTHER): Payer: No Typology Code available for payment source | Admitting: Gastroenterology

## 2022-10-25 ENCOUNTER — Encounter: Payer: Self-pay | Admitting: Gastroenterology

## 2022-10-25 VITALS — BP 146/83 | HR 71 | Temp 98.1°F | Ht 63.0 in | Wt 136.0 lb

## 2022-10-25 DIAGNOSIS — R1011 Right upper quadrant pain: Secondary | ICD-10-CM | POA: Diagnosis not present

## 2022-10-25 DIAGNOSIS — R634 Abnormal weight loss: Secondary | ICD-10-CM | POA: Diagnosis not present

## 2022-10-25 DIAGNOSIS — R1084 Generalized abdominal pain: Secondary | ICD-10-CM

## 2022-10-25 MED ORDER — OMEPRAZOLE 40 MG PO CPDR
40.0000 mg | DELAYED_RELEASE_CAPSULE | Freq: Two times a day (BID) | ORAL | 0 refills | Status: DC
Start: 2022-10-25 — End: 2023-05-11

## 2022-10-25 NOTE — Patient Instructions (Signed)
Please call [phone] if you need to reschedule your RUQ ultrasound and HIDA Scan. Please do not eat or drink after midnight the night before your 2 images.Thank you.

## 2022-10-25 NOTE — Progress Notes (Addendum)
Wyline Mood MD, MRCP(U.K) [address]  [address]  [place], [place] [address]  Main: [phone]  Fax: [phone]   Gastroenterology Consultation  Referring Provider:     [name], [name] Primary Care Physician:  [name], [name] Primary Gastroenterologist:  Dr. Wyline Mood  Reason for Consultation:  Abdominal pain         HPI:   Courtney Wyatt is a 37 y.o. y/o female referred for consultation & management  by [name], [name].     She is here to see me for abdominal pain which is affecting her pretty significantly.  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  Since July 2024 she had been taking 600 to 1200 mg of ibuprofen on a regular basis for at least 5-6 times a week for migraines with no PPI cover.  She had spoken to me at some point of time regarding abdominal pain and I suggested she stop taking the NSAIDs.  Following that the abdominal pain which she had in her center of the abdomen which was constant no aggravating or relieving factors not affected a bowel bowel movement has resolved but she has developed a right upper quadrant discomfort which usually begins about 20 minutes after meals worse after fatty meals lasting about 1 to 2 hours.  Radiating sometimes to her shoulder as well as her neck.  Not affected by bowel movements.  She has noticed pain particularly worse with red meat.  Denies any blood in her stool.  She is okay with consuming carbs.  Recent medications have been Topamax for headaches she has lost 10 pounds since July because it hurts to eat and she is just not had a good appetite she is also had a sense of regurgitation in the morning with nausea for the past 2 weeks affecting her for the whole day.  Denies any exposure to marijuana.  No known allergies.  The symptoms are not affected by her cycle nor are affected by sexual intercourse.  She denies any family history of cancers except melanoma.  She has been on Adderall which is long-term along with  Zoloft and her migraine meds.  No new labs since March of this year.  We discussed about any stressors in her life and at present it is her medical particularly her GI symptoms which is getting her stressed otherwise nothing in addition.   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  Colonel Bald   WISDOM TOOTH EXTRACTION      Prior to Admission medications   Medication Sig Start Date End Date Taking? Authorizing Provider  aluminum chloride (DRYSOL) 20 % external solution Apply topically  at bedtime. 01/19/22   [name], [name]  Atogepant (QULIPTA) 60 MG TABS Take by mouth daily.    [provider]  butalbital-acetaminophen-caffeine (FIORICET) 470 070 9328 MG tablet Take 1-2 tablets by mouth every 6 (six) hours as needed for headache. 07/22/22 07/22/23  Merita Norton T, [name]  magnesium oxide (MAG-OX) 400 (240 Mg) MG tablet Take 400 mg by mouth daily.    [provider]  methylphenidate 27 MG PO CR tablet Take 1 tablet (27 mg total) by mouth every morning. 08/18/22   [name], [name]  rizatriptan (MAXALT) 10 MG tablet Take 1 tablet (10 mg total) by mouth as needed. May repeat in 2 hours if needed 09/07/22   Ocie Doyne, MD  sertraline (ZOLOFT) 100 MG tablet Take 1 tablet (100 mg total)  by mouth daily. 07/22/22   [name], [name]  tiZANidine (ZANAFLEX) 4 MG tablet Take 1 tablet (4 mg total) by mouth every 6 (six) hours as needed for muscle spasms. 07/22/22   [name], [name]  topiramate (TOPAMAX) 100 MG tablet Take 1 tablet (100 mg total) by mouth at bedtime. 10/15/22   Levert Feinstein, MD    Family History  Problem Relation Age of Onset   Hyperlipidemia Mother    Depression Mother    Hypertension Mother    Hypertension Father    Diabetes Father    Breast cancer Maternal Aunt 74   Lung cancer Maternal Grandmother      Social History   Tobacco Use   Smoking status: Former    Current packs/day: 0.00    Average packs/day: 0.5 packs/day for 5.0 years (2.5 ttl pk-yrs)    Types: Cigarettes    Start date: 10/21/2010    Quit date: 10/21/2015    Years since quitting: 7.0   Smokeless tobacco: Never  Vaping Use   Vaping status: Never Used  Substance Use Topics   Alcohol use: Not Currently    Comment: occ   Drug use: No    Allergies as of 10/25/2022   (No Known Allergies)    Review of Systems:    All systems reviewed and negative except where noted in HPI.   Physical Exam:  BP (!) 146/83   Pulse 71   Temp 98.1 F (36.7 C) (Oral)   Ht 5\' 3"  (1.6 m)   Wt 136 lb (61.7 kg)   LMP 09/19/2022 (Approximate)   BMI 24.09 kg/m  Patient's last menstrual period was 09/19/2022 (approximate). Psych:  Alert and cooperative. Normal mood and affect. General:   Alert,  Well-developed, well-nourished, pleasant and cooperative in NAD Head:  Normocephalic and atraumatic. Eyes:  Sclera clear, no icterus.   Conjunctiva pink. Ears:  Normal auditory acuity. Neck:  Supple; no masses or thyromegaly. Lungs:  Respirations even and unlabored.  Clear throughout to auscultation.   No wheezes, crackles, or rhonchi. No acute distress. Heart:  Regular rate and rhythm; no murmurs, clicks, rubs, or gallops. Abdomen:  Normal bowel sounds.  No bruits.  Soft, non-tender and non-distended  without masses, hepatosplenomegaly or hernias noted.  No guarding or rebound tenderness.    Neurologic:  Alert and oriented x3;  grossly normal neurologically. Psych:  Alert and cooperative. Normal mood and affect.  Imaging Studies: No results found.  Assessment and Plan:   Courtney Wyatt is a 37 y.o. y/o female to see me for abdominal pain ongoing since July 2024 associated with a few months of NSAID use on a regular basis.  Here once  the NSAIDs were stopped the initial nature of the abdominal pain resolved and she developed new onset right upper quadrant abdominal discomfort worse after meals particularly red meat and fatty meals.  Her mother has had her gallbladder taken out in her 88s.  There is some weight loss of around 10 pounds since July 2024 which she attributes to her inability to eat due to her abdominal and GI symptoms.  Impression: It appears that her initial GI symptoms may be due to the effect of NSAIDs and presently her GI symptoms with pain in the right upper quadrant are more suggestive of biliary in nature.  Differentials would be either biliary colic or pain from biliary dyskinesia.   Plan  CBC,CMP,TSH,Celiac , alpha gal, ESR, CRP, HIV H pylori breath test  Limit NSAID use PPI 40 mg twice daily trial for 6 to 8 weeks if negative will require EGD RUQ USG and HIDA scan to evaluate for gall stones/biliary dyskinesia   Follow up in as needed  Dr Wyline Mood MD,MRCP(U.K)

## 2022-10-26 LAB — H. PYLORI BREATH TEST: H pylori Breath Test: NEGATIVE

## 2022-10-27 ENCOUNTER — Ambulatory Visit: Payer: No Typology Code available for payment source | Admitting: Anesthesiology

## 2022-10-27 ENCOUNTER — Encounter
Admission: RE | Disposition: A | Discharge status: Home / Self Care | Payer: Self-pay | Source: Home / Self Care | Attending: Otolaryngology

## 2022-10-27 ENCOUNTER — Other Ambulatory Visit: Payer: Self-pay

## 2022-10-27 ENCOUNTER — Ambulatory Visit
Admission: RE | Admit: 2022-10-27 | Discharge: 2022-10-27 | Disposition: A | Discharge status: Home / Self Care | Payer: No Typology Code available for payment source | Attending: Otolaryngology | Admitting: Otolaryngology

## 2022-10-27 DIAGNOSIS — R519 Headache, unspecified: Secondary | ICD-10-CM | POA: Insufficient documentation

## 2022-10-27 DIAGNOSIS — F32A Depression, unspecified: Secondary | ICD-10-CM | POA: Insufficient documentation

## 2022-10-27 DIAGNOSIS — K116 Mucocele of salivary gland: Secondary | ICD-10-CM | POA: Diagnosis present

## 2022-10-27 DIAGNOSIS — Z87891 Personal history of nicotine dependence: Secondary | ICD-10-CM | POA: Insufficient documentation

## 2022-10-27 DIAGNOSIS — K219 Gastro-esophageal reflux disease without esophagitis: Secondary | ICD-10-CM | POA: Insufficient documentation

## 2022-10-27 DIAGNOSIS — F419 Anxiety disorder, unspecified: Secondary | ICD-10-CM | POA: Diagnosis not present

## 2022-10-27 HISTORY — DX: Presence of spectacles and contact lenses: Z97.3

## 2022-10-27 HISTORY — PX: MASS EXCISION: SHX2000

## 2022-10-27 HISTORY — DX: Migraine, unspecified, not intractable, without status migrainosus: G43.909

## 2022-10-27 HISTORY — DX: Headache, unspecified: R51.9

## 2022-10-27 LAB — POCT PREGNANCY, URINE: Preg Test, Ur: NEGATIVE

## 2022-10-27 SURGERY — EXCISION MASS
Anesthesia: General | Site: Mouth | Laterality: Left

## 2022-10-27 MED ORDER — PROPOFOL 10 MG/ML IV BOLUS
INTRAVENOUS | Status: AC
Start: 2022-10-27 — End: ?
  Filled 2022-10-27: qty 20

## 2022-10-27 MED ORDER — SODIUM CHLORIDE 0.9% FLUSH: 10.0000 mL | Freq: Two times a day (BID) | INTRAVENOUS | Status: DC

## 2022-10-27 MED ORDER — ONDANSETRON HCL 4 MG/2ML IJ SOLN
INTRAMUSCULAR | Status: DC | PRN
  Administered 2022-10-27: 4 mg via INTRAVENOUS

## 2022-10-27 MED ORDER — LIDOCAINE HCL (CARDIAC) PF 100 MG/5ML IV SOSY
PREFILLED_SYRINGE | INTRAVENOUS | Status: DC | PRN
  Administered 2022-10-27: 20 mg via INTRAVENOUS

## 2022-10-27 MED ORDER — ONDANSETRON HCL 4 MG PO TABS: 4.0000 mg | ORAL_TABLET | Freq: Three times a day (TID) | ORAL | 0 refills | Status: DC | PRN

## 2022-10-27 MED ORDER — LIDOCAINE-EPINEPHRINE 1 %-1:100000 IJ SOLN
INTRAMUSCULAR | Status: DC | PRN
  Administered 2022-10-27: 1 mL

## 2022-10-27 MED ORDER — MIDAZOLAM HCL 2 MG/2ML IJ SOLN
INTRAMUSCULAR | Status: DC | PRN
  Administered 2022-10-27: 2 mg via INTRAVENOUS

## 2022-10-27 MED ORDER — LIDOCAINE HCL (PF) 2 % IJ SOLN
INTRAMUSCULAR | Status: AC
Start: 2022-10-27 — End: ?
  Filled 2022-10-27: qty 5

## 2022-10-27 MED ORDER — SODIUM CHLORIDE 0.9 % IV SOLN: INTRAVENOUS | Status: DC | PRN

## 2022-10-27 MED ORDER — ONDANSETRON HCL 4 MG/2ML IJ SOLN
INTRAMUSCULAR | Status: AC
  Filled 2022-10-27: qty 2

## 2022-10-27 MED ORDER — SODIUM CHLORIDE 0.9% FLUSH
INTRAVENOUS | Status: DC | PRN
  Administered 2022-10-27 (×2): 10 mL via INTRAVENOUS

## 2022-10-27 MED ORDER — PROMETHAZINE HCL 12.5 MG PO TABS: 12.5000 mg | ORAL_TABLET | Freq: Four times a day (QID) | ORAL | 0 refills | Status: DC | PRN

## 2022-10-27 MED ORDER — PROPOFOL 10 MG/ML IV BOLUS
INTRAVENOUS | Status: DC | PRN
  Administered 2022-10-27: 40 mg via INTRAVENOUS
  Administered 2022-10-27: 30 mg via INTRAVENOUS
  Administered 2022-10-27: 80 mg via INTRAVENOUS
  Administered 2022-10-27: 30 mg via INTRAVENOUS

## 2022-10-27 MED ORDER — PROPOFOL 500 MG/50ML IV EMUL
INTRAVENOUS | Status: DC | PRN
  Administered 2022-10-27: 125 ug/kg/min via INTRAVENOUS

## 2022-10-27 MED ORDER — MIDAZOLAM HCL 2 MG/2ML IJ SOLN
INTRAMUSCULAR | Status: AC
Start: 2022-10-27 — End: ?
  Filled 2022-10-27: qty 2

## 2022-10-27 MED ORDER — PROPOFOL 1000 MG/100ML IV EMUL
INTRAVENOUS | Status: AC
Start: 2022-10-27 — End: ?
  Filled 2022-10-27: qty 100

## 2022-10-27 SURGICAL SUPPLY — 37 items
ADH SKN CLS APL DERMABOND .7 (GAUZE/BANDAGES/DRESSINGS)
APL FBRTP 3 NS LF CTTN WD (MISCELLANEOUS) ×1
APPLICATOR COTTON TIP 3IN (MISCELLANEOUS) ×1 IMPLANT
BLADE ELECT COATED/INSUL 125 (ELECTRODE) ×1 IMPLANT
COAG SUCTION FOOTSWITCH 10FR (SUCTIONS) ×1 IMPLANT
CORD BIP STRL DISP 12FT (MISCELLANEOUS) IMPLANT
DERMABOND ADVANCED .7 DNX12 (GAUZE/BANDAGES/DRESSINGS) IMPLANT
DRSG TELFA 4X3 1S NADH ST (GAUZE/BANDAGES/DRESSINGS) IMPLANT
ELECT CAUTERY BLADE TIP 2.5 (TIP)
ELECT CAUTERY NDL 2.0 MIC (NEEDLE) ×1 IMPLANT
ELECT CAUTERY NEEDLE 2.0 MIC (NEEDLE) ×1 IMPLANT
ELECT REM PT RETURN 9FT ADLT (ELECTROSURGICAL) ×1
ELECTRODE CAUTERY BLDE TIP 2.5 (TIP) IMPLANT
ELECTRODE REM PT RTRN 9FT ADLT (ELECTROSURGICAL) ×1 IMPLANT
GAUZE SPONGE 4X4 12PLY STRL (GAUZE/BANDAGES/DRESSINGS) IMPLANT
GLOVE SURG GAMMEX PI TX LF 7.5 (GLOVE) ×2 IMPLANT
GOWN STRL REUS W/ TWL LRG LVL3 (GOWN DISPOSABLE) ×1 IMPLANT
GOWN STRL REUS W/TWL LRG LVL3 (GOWN DISPOSABLE) ×1
KIT TURNOVER KIT A (KITS) ×1 IMPLANT
NDL HYPO 25GX1X1/2 BEV (NEEDLE) ×1 IMPLANT
NEEDLE HYPO 25GX1X1/2 BEV (NEEDLE) ×1 IMPLANT
NS IRRIG 500ML POUR BTL (IV SOLUTION) ×1 IMPLANT
PACK ENT CUSTOM (PACKS) ×1 IMPLANT
PENCIL SMOKE EVACUATOR (MISCELLANEOUS) ×1 IMPLANT
SOL PREP PVP 2OZ (MISCELLANEOUS) ×1
SOLUTION PREP PVP 2OZ (MISCELLANEOUS) ×1 IMPLANT
SPONGE KITTNER 5P (MISCELLANEOUS) ×1 IMPLANT
STRAP BODY AND KNEE 60X3 (MISCELLANEOUS) ×1 IMPLANT
SUCTION TUBE FRAZIER 10FR DISP (SUCTIONS) IMPLANT
SUT CHROMIC 5-0 (SUTURE) ×1
SUT CHROMIC 5-0 P2 18XMFL CR (SUTURE) ×1
SUT PROLENE 5 0 P 3 (SUTURE) ×1 IMPLANT
SUT VIC AB 4-0 RB1 27 (SUTURE) ×1
SUT VIC AB 4-0 RB1 27X BRD (SUTURE) ×1 IMPLANT
SUTURE CHRMC 5-0 P2 18XMF CR (SUTURE) IMPLANT
SYR 10ML LL (SYRINGE) ×1 IMPLANT
SYR EAR/ULCER 2OZ (SYRINGE) ×1 IMPLANT

## 2022-10-27 NOTE — Op Note (Signed)
..  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  No other abnormal findings or masses or lesions seen.  Procedure: With the patient in a comfortable supine position, IV sedation anesthesia was administered.  At an appropriate level, under magnification the mass in the patient's left lower lip was palpated.  1% Lidocaine with 1:100,000 epinephrine was injected surrounding the lower lip mass with a volume of 1ml.  A 15 blade scalpel was used to incised overlying the mass and feather the mucosa and muscle fibers.  This demonstrated a very thin walled cystic mass and attached minor salivary gland consistent with mucocele.  This was dissected with mosquito forceps.  A rent was made in the cystic portion of the mass and mucoid drainage was suctioned.  A chalazion clamp was attached to the lower lip delivering the minor salivary gland and residual cyst.  Circumferential dissection was made with tenotomy scissors.  The mass was fully removed without complication.  Evaluation of the wound was made and no further lesions or concerns of palpable abnormality was seen.  The chalazion clamp was removed.  The wound was closed in interupted fashion with fast absorbing plain gut suture.  Following this  The patient was returned to anesthesia, awakened, and transferred to recovery in stable condition.  Dispo:  PACU to home  Plan: No restrictions.  Will call with pathology.   Meika Earll 10:16 AM 10/27/2022

## 2022-10-27 NOTE — H&P (Signed)
..  History and Physical paper copy reviewed and updated date of procedure and will be scanned into system.  Patient seen and examined and marked.  

## 2022-10-27 NOTE — Anesthesia Postprocedure Evaluation (Signed)
Anesthesia Post Note  Patient: Courtney Wyatt  Procedure(s) Performed: EXCISION OF LOWER LIP MUCOCELE (Left: Mouth)  Patient location during evaluation: PACU Anesthesia Type: General Level of consciousness: awake and alert, oriented and patient cooperative Pain management: pain level controlled Vital Signs Assessment: post-procedure vital signs reviewed and stable Respiratory status: spontaneous breathing, nonlabored ventilation and respiratory function stable Cardiovascular status: blood pressure returned to baseline and stable Postop Assessment: adequate PO intake Anesthetic complications: no   No notable events documented.   Last Vitals:  Vitals:   10/27/22 1035 10/27/22 1037  BP:  (!) 99/58  Pulse: (!) 56 (!) 51  Resp: 11 11  Temp:  36.5 C  SpO2: 99% 100%    Last Pain:  Vitals:   10/27/22 1037  TempSrc:   PainSc: 0-No pain                 Reed Breech

## 2022-10-27 NOTE — Anesthesia Preprocedure Evaluation (Addendum)
Anesthesia Evaluation  Patient identified by MRN, date of birth, ID band Patient awake    Reviewed: Allergy & Precautions, NPO status , Patient's Chart, lab work & pertinent test results  History of Anesthesia Complications (+) PONV and history of anesthetic complications  Airway Mallampati: II   Neck ROM: Full    Dental no notable dental hx.    Pulmonary former smoker (quit 2017)   Pulmonary exam normal breath sounds clear to auscultation       Cardiovascular Exercise Tolerance: Good negative cardio ROS Normal cardiovascular exam Rhythm:Regular Rate:Normal     Neuro/Psych  Headaches (chronic daily) PSYCHIATRIC DISORDERS (ADD) Anxiety Depression       GI/Hepatic negative GI ROS,,,  Endo/Other    Renal/GU negative Renal ROS     Musculoskeletal   Abdominal   Peds  Hematology negative hematology ROS (+)   Anesthesia Other Findings   Reproductive/Obstetrics                             Anesthesia Physical Anesthesia Plan  ASA: 2  Anesthesia Plan: General   Post-op Pain Management:    Induction: Intravenous  PONV Risk Score and Plan: 4 or greater and Propofol infusion, TIVA, Treatment may vary due to age or medical condition and Ondansetron  Airway Management Planned: Natural Airway  Additional Equipment:   Intra-op Plan:   Post-operative Plan:   Informed Consent: I have reviewed the patients History and Physical, chart, labs and discussed the procedure including the risks, benefits and alternatives for the proposed anesthesia with the patient or authorized representative who has indicated his/her understanding and acceptance.       Plan Discussed with: CRNA  Anesthesia Plan Comments: (LMA/GETA backup discussed.  Patient consented for risks of anesthesia including but not limited to:  - adverse reactions to medications - damage to eyes, teeth, lips or other oral mucosa -  nerve damage due to positioning  - sore throat or hoarseness - damage to heart, brain, nerves, lungs, other parts of body or loss of life  Informed patient about role of CRNA in peri- and intra-operative care.  Patient voiced understanding.)       Anesthesia Quick Evaluation

## 2022-10-27 NOTE — Transfer of Care (Signed)
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

## 2022-10-28 ENCOUNTER — Encounter: Payer: Self-pay | Admitting: Otolaryngology

## 2022-10-28 ENCOUNTER — Ambulatory Visit: Payer: No Typology Code available for payment source | Admitting: Gastroenterology

## 2022-10-28 LAB — COMPREHENSIVE METABOLIC PANEL
ALT: 16 [IU]/L (ref 0–32)
AST: 18 IU/L (ref 0–40)
Albumin: 4.7 g/dL (ref 3.9–4.9)
Alkaline Phosphatase: 47 [IU]/L (ref 44–121)
BUN/Creatinine Ratio: 18 (ref 9–23)
BUN: 15 mg/dL (ref 6–20)
Bilirubin Total: 0.6 mg/dL (ref 0.0–1.2)
CO2: 19 mmol/L — ABNORMAL LOW (ref 20–29)
Calcium: 10.4 mg/dL — ABNORMAL HIGH (ref 8.7–10.2)
Chloride: 107 mmol/L — ABNORMAL HIGH (ref 96–106)
Creatinine, Ser: 0.83 mg/dL (ref 0.57–1.00)
Globulin, Total: 2.2 g/dL (ref 1.5–4.5)
Glucose: 88 mg/dL (ref 70–99)
Potassium: 4.2 mmol/L (ref 3.5–5.2)
Sodium: 140 mmol/L (ref 134–144)
Total Protein: 6.9 g/dL (ref 6.0–8.5)
eGFR: 94 mL/min/{1.73_m2} (ref >59)

## 2022-10-28 LAB — CBC WITH DIFFERENTIAL/PLATELET
Basophils Absolute: 0.1 10*3/uL (ref 0.0–0.2)
Basos: 1 %
EOS (ABSOLUTE): 0.1 10*3/uL (ref 0.0–0.4)
Eos: 1 %
Hematocrit: 42.4 % (ref 34.0–46.6)
Hemoglobin: 13.6 g/dL (ref 11.1–15.9)
Immature Grans (Abs): 0 10*3/uL (ref 0.0–0.1)
Immature Granulocytes: 0 %
Lymphocytes Absolute: 2.2 10*3/uL (ref 0.7–3.1)
Lymphs: 34 %
MCH: 29.4 pg (ref 26.6–33.0)
MCHC: 32.1 g/dL (ref 31.5–35.7)
MCV: 92 fL (ref 79–97)
Monocytes Absolute: 0.4 10*3/uL (ref 0.1–0.9)
Monocytes: 6 %
Neutrophils Absolute: 3.7 10*3/uL (ref 1.4–7.0)
Neutrophils: 58 %
Platelets: 335 10*3/uL (ref 150–450)
RBC: 4.62 x10E6/uL (ref 3.77–5.28)
RDW: 12 % (ref 11.7–15.4)
WBC: 6.3 10*3/uL (ref 3.4–10.8)

## 2022-10-28 LAB — ALPHA-GAL PANEL
Allergen Lamb IgE: <0.1 kU/L
Beef IgE: <0.1 kU/L
IgE (Immunoglobulin E), Serum: 7 [IU]/mL (ref 6–495)
O215-IgE Alpha-Gal: <0.1 kU/L
Pork IgE: <0.1 kU/L

## 2022-10-28 LAB — TSH: TSH: 0.576 u[IU]/mL (ref 0.450–4.500)

## 2022-10-28 LAB — CELIAC DISEASE AB SCREEN W/RFX
Antigliadin Abs, IgA: 4 U (ref 0–19)
IgA/Immunoglobulin A, Serum: 241 mg/dL (ref 87–352)
Transglutaminase IgA: <2 U/mL (ref 0–3)

## 2022-10-28 LAB — HIV ANTIBODY (ROUTINE TESTING W REFLEX): HIV Screen 4th Generation wRfx: NONREACTIVE

## 2022-10-28 LAB — C-REACTIVE PROTEIN: CRP: <1 mg/L (ref 0–10)

## 2022-10-28 LAB — SURGICAL PATHOLOGY

## 2022-10-28 LAB — SEDIMENTATION RATE: Sed Rate: 2 mm/h (ref 0–32)

## 2022-11-03 ENCOUNTER — Ambulatory Visit
Admission: RE | Admit: 2022-11-03 | Discharge: 2022-11-03 | Disposition: A | Discharge status: Home / Self Care | Payer: No Typology Code available for payment source | Source: Ambulatory Visit | Attending: Gastroenterology | Admitting: Gastroenterology

## 2022-11-03 DIAGNOSIS — R1084 Generalized abdominal pain: Secondary | ICD-10-CM | POA: Insufficient documentation

## 2022-11-03 DIAGNOSIS — R634 Abnormal weight loss: Secondary | ICD-10-CM | POA: Insufficient documentation

## 2022-11-08 ENCOUNTER — Ambulatory Visit
Admission: RE | Admit: 2022-11-08 | Discharge: 2022-11-08 | Disposition: A | Discharge status: Home / Self Care | Payer: No Typology Code available for payment source | Source: Ambulatory Visit | Attending: Gastroenterology | Admitting: Gastroenterology

## 2022-11-08 ENCOUNTER — Ambulatory Visit: Payer: No Typology Code available for payment source

## 2022-11-08 ENCOUNTER — Other Ambulatory Visit: Payer: No Typology Code available for payment source

## 2022-11-08 DIAGNOSIS — R634 Abnormal weight loss: Secondary | ICD-10-CM | POA: Diagnosis present

## 2022-11-08 DIAGNOSIS — R1084 Generalized abdominal pain: Secondary | ICD-10-CM | POA: Diagnosis present

## 2022-11-08 MED ORDER — TECHNETIUM TC 99M MEBROFENIN IV KIT
5.0000 | PACK | Freq: Once | INTRAVENOUS | Status: AC | PRN
  Administered 2022-11-08: 5.17 via INTRAVENOUS

## 2022-11-25 ENCOUNTER — Ambulatory Visit (INDEPENDENT_AMBULATORY_CARE_PROVIDER_SITE_OTHER): Payer: No Typology Code available for payment source | Admitting: Neurology

## 2022-11-25 ENCOUNTER — Encounter: Payer: Self-pay | Admitting: Neurology

## 2022-11-25 VITALS — BP 112/74 | HR 80 | Ht 63.0 in | Wt 130.0 lb

## 2022-11-25 DIAGNOSIS — M542 Cervicalgia: Secondary | ICD-10-CM | POA: Diagnosis not present

## 2022-11-25 DIAGNOSIS — G43101 Migraine with aura, not intractable, with status migrainosus: Secondary | ICD-10-CM | POA: Diagnosis not present

## 2022-11-25 MED ORDER — QULIPTA 60 MG PO TABS: 60.0000 mg | ORAL_TABLET | Freq: Every day | ORAL | 3 refills | Status: DC

## 2022-11-25 MED ORDER — TIZANIDINE HCL 4 MG PO TABS: 4.0000 mg | ORAL_TABLET | Freq: Four times a day (QID) | ORAL | 6 refills | Status: DC | PRN

## 2022-11-25 MED ORDER — ONDANSETRON HCL 4 MG PO TABS: 4.0000 mg | ORAL_TABLET | Freq: Three times a day (TID) | ORAL | 6 refills | Status: DC | PRN

## 2022-11-25 MED ORDER — TOPIRAMATE 100 MG PO TABS: 100.0000 mg | ORAL_TABLET | Freq: Every evening | ORAL | 3 refills | Status: DC

## 2022-11-25 NOTE — Progress Notes (Signed)
Chief Complaint  Patient presents with   Migraine    Rm 15 alone Pt is well, reports her migraines have improved since last visit.  She has about 1-3 migraines a month, some months none.    ASSESSMENT AND PLAN  Courtney Wyatt is a 37 y.o. female   Chronic migraine with visual aura History of medication overuse rebound headache,  Much improved with Topamax 100 mg every night, Qulipta 60 mg daily, Maxalt as needed was helpful too  Refilled her preventive medications, if her headache began to improve, may consider gradually weaning off Qulipta, then Topamax last as daily preventive medication  Maxalt as needed for rescue treatment, may combine with Zofran, tizanidine for prolonged severe headaches  DIAGNOSTIC DATA (LABS, IMAGING, TESTING) - I reviewed patient records, labs, notes, testing and imaging myself where available.   MEDICAL HISTORY:  Courtney Wyatt, is a 37 year old female follow-up for migraine headaches, she was seen by Dr. Delena Bali in July 2024   History is obtained from the patient and review of electronic medical records. I personally reviewed pertinent available imaging films in PACS.   PMHx of   She had long history of chronic headache, usually proceeding with visual aura, then retro-orbital area severe pounding headache with light, noise sensitivity, in 2024, she developed frequent headache, self medicate herself with multiple dose of ibuprofen, then developed gastric ulcer, after stopped using daily ibuprofen in July 2024, she has developed massive headache, presenting to emergency room on August 20, 2022  Had extensive evaluation, CT angiogram head showed no large vessel disease, no aneurysm MRI of the brain with without contrast showed no acute abnormality  She was put on Qulipta 60 mg daily, Topamax 100 mg every night as preventive medication, headache has much improved, only have to take Maxalt once over past 2 months, works well for her headache, but occasionally  headaches comes back  PHYSICAL EXAM:   Vitals:   11/25/22 1127  BP: 112/74  Pulse: 80  Weight: 130 lb (59 kg)  Height: 5\' 3"  (1.6 m)   Body mass index is 23.03 kg/m.  PHYSICAL EXAMNIATION:  Gen: NAD, conversant, well nourised, well groomed                     Cardiovascular: Regular rate rhythm, no peripheral edema, warm, nontender. Eyes: Conjunctivae clear without exudates or hemorrhage Neck: Supple, no carotid bruits. Pulmonary: Clear to auscultation bilaterally   NEUROLOGICAL EXAM:  MENTAL STATUS: Speech/cognition: Awake, alert, oriented to history taking and casual conversation CRANIAL NERVES: CN II: Visual fields are full to confrontation. Pupils are round equal and briskly reactive to light. CN III, IV, VI: extraocular movement are normal. No ptosis. CN V: Facial sensation is intact to light touch CN VII: Face is symmetric with normal eye closure  CN VIII: Hearing is normal to causal conversation. CN IX, X: Phonation is normal. CN XI: Head turning and shoulder shrug are intact  MOTOR: There is no pronator drift of out-stretched arms. Muscle bulk and tone are normal. Muscle strength is normal.  REFLEXES: Reflexes are 3 and symmetric at the biceps, triceps, knees, and ankles. Plantar responses are flexor.  SENSORY: Intact to light touch, pinprick and vibratory sensation are intact in fingers and toes.  COORDINATION: There is no trunk or limb dysmetria noted.  GAIT/STANCE: Posture is normal. Gait is steady with normal steps, base, arm swing, and turning. Heel and toe walking are normal. Tandem gait is normal.  Romberg is  absent.  REVIEW OF SYSTEMS:  Full 14 system review of systems performed and notable only for as above All other review of systems were negative.   ALLERGIES: No Known Allergies  HOME MEDICATIONS: Current Outpatient Medications  Medication Sig Dispense Refill   Atogepant (QULIPTA) 60 MG TABS Take by mouth daily.     magnesium oxide  (MAG-OX) 400 (240 Mg) MG tablet Take 400 mg by mouth daily.     methylphenidate 27 MG PO CR tablet Take 1 tablet (27 mg total) by mouth every morning. 90 tablet 0   omeprazole (PRILOSEC) 40 MG capsule Take 1 capsule (40 mg total) by mouth 2 times daily at 12 noon and 4 pm. 180 capsule 0   rizatriptan (MAXALT) 10 MG tablet Take 1 tablet (10 mg total) by mouth as needed. May repeat in 2 hours if needed 10 tablet 6   sertraline (ZOLOFT) 100 MG tablet Take 1 tablet (100 mg total) by mouth daily. 100 tablet 3   topiramate (TOPAMAX) 100 MG tablet Take 1 tablet (100 mg total) by mouth at bedtime. 90 tablet 3   No current facility-administered medications for this visit.    PAST MEDICAL HISTORY: Past Medical History:  Diagnosis Date   ADD (attention deficit disorder)    Anxiety    Bacterial vaginosis    Chronic daily headache    Depression    Herpes, genital 02/2008   HSV TYPE I BY CX   History of Papanicolaou smear of cervix 07/10/2013; 10/21/2014   NEG; NEG   Hyperhidrosis 04/18/2013   Interstitial cystitis    Migraine headache    1-2 per month   PONV (postoperative nausea and vomiting)    SAB (spontaneous abortion) 07/14/2016   Wears contact lenses     PAST SURGICAL HISTORY: Past Surgical History:  Procedure Laterality Date   CESAREAN SECTION N/A 08/25/2017   Procedure: CESAREAN SECTION;  Surgeon: Richarda Overlie, MD;  Location: [facility];  Service: Obstetrics;  Laterality: N/A;   LAPAROSCOPY  04/2004   MASS EXCISION Left 10/27/2022   Procedure: EXCISION OF LOWER LIP MUCOCELE;  Surgeon: Bud Face, MD;  Location: [facility];  Service: ENT;  Laterality: Left;   TONSILLECTOMY  1994   DR.  Colonel Bald   WISDOM TOOTH EXTRACTION      FAMILY HISTORY: Family History  Problem Relation Age of Onset   Hyperlipidemia Mother    Depression Mother    Hypertension Mother    Hypertension Father    Diabetes Father    Breast cancer Maternal Aunt 30   Lung cancer  Maternal Grandmother     SOCIAL HISTORY: Social History   Socioeconomic History   Marital status: Married    Spouse name: Not on file   Number of children: 0   Years of education: Not on file   Highest education level: Not on file  Occupational History   Not on file  Tobacco Use   Smoking status: Former    Current packs/day: 0.00    Average packs/day: 0.5 packs/day for 5.0 years (2.5 ttl pk-yrs)    Types: Cigarettes    Start date: 10/21/2010    Quit date: 10/21/2015    Years since quitting: 7.1   Smokeless tobacco: Never  Vaping Use   Vaping status: Never Used  Substance and Sexual Activity   Alcohol use: Not Currently    Comment: occ   Drug use: No   Sexual activity: Yes    Birth control/protection: None  Other Topics  Concern   Not on file  Social History Narrative   Right handed   Drink coffee 2-3 times per day   Social Determinants of Health   Financial Resource Strain: Low Risk  (08/23/2017)   Overall Financial Resource Strain (CARDIA)    Difficulty of Paying Living Expenses: Not hard at all  Food Insecurity: No Food Insecurity (08/23/2017)   Hunger Vital Sign    Worried About Running Out of Food in the Last Year: Never true    Ran Out of Food in the Last Year: Never true  Transportation Needs: No Transportation Needs (08/23/2017)   PRAPARE - Administrator, Civil Service (Medical): No    Lack of Transportation (Non-Medical): No  Physical Activity: Inactive (08/23/2017)   Exercise Vital Sign    Days of Exercise per Week: 0 days    Minutes of Exercise per Session: 0 min  Stress: No Stress Concern Present (08/23/2017)   [employer] of Occupational Health - Occupational Stress Questionnaire    Feeling of Stress : Not at all  Social Connections: Unknown (08/24/2017)   Social Connection and Isolation Panel [NHANES]    Frequency of Communication with Friends and Family: Patient declined    Frequency of Social Gatherings with Friends and Family:  Patient declined    Attends Religious Services: Patient declined    Database administrator or Organizations: Patient declined    Attends Banker Meetings: Patient declined    Marital Status: Patient declined  Intimate Partner Violence: Not At Risk (08/23/2017)   Humiliation, Afraid, Rape, and Kick questionnaire    Fear of Current or Ex-Partner: No    Emotionally Abused: No    Physically Abused: No    Sexually Abused: No      Levert Feinstein, M.D. Ph.D.  [facility] [address] [place], [place] [address] Ph: [phone] Fax: [phone]  CC:  [name], [name] [address] [place],  [place] [address]  [name], [name]

## 2022-12-27 ENCOUNTER — Encounter: Payer: Self-pay | Admitting: Family Medicine

## 2022-12-30 ENCOUNTER — Other Ambulatory Visit: Payer: Self-pay

## 2022-12-30 MED ORDER — METHYLPHENIDATE HCL ER (OSM) 27 MG PO TBCR: 27.0000 mg | EXTENDED_RELEASE_TABLET | ORAL | 0 refills | Status: DC

## 2023-01-22 ENCOUNTER — Other Ambulatory Visit: Payer: Self-pay | Admitting: Gastroenterology

## 2023-02-14 ENCOUNTER — Other Ambulatory Visit: Payer: Self-pay

## 2023-02-14 ENCOUNTER — Telehealth: Payer: Self-pay | Admitting: Family Medicine

## 2023-02-14 DIAGNOSIS — F39 Unspecified mood [affective] disorder: Secondary | ICD-10-CM

## 2023-02-14 NOTE — Telephone Encounter (Signed)
[facility] faxed refill request for the following medications: ° °sertraline (ZOLOFT) 100 MG tablet  ° °Please advise. ° °

## 2023-02-16 ENCOUNTER — Telehealth: Payer: Self-pay | Admitting: Physician Assistant

## 2023-02-16 ENCOUNTER — Other Ambulatory Visit: Payer: Self-pay

## 2023-02-16 DIAGNOSIS — F39 Unspecified mood [affective] disorder: Secondary | ICD-10-CM

## 2023-02-16 NOTE — Telephone Encounter (Signed)
[facility] is [facility] refill sertraline (ZOLOFT) 100 MG tablet  Please advise

## 2023-03-01 ENCOUNTER — Ambulatory Visit: Payer: No Typology Code available for payment source | Admitting: Family Medicine

## 2023-03-14 ENCOUNTER — Ambulatory Visit (INDEPENDENT_AMBULATORY_CARE_PROVIDER_SITE_OTHER): Payer: No Typology Code available for payment source | Admitting: Family Medicine

## 2023-03-14 ENCOUNTER — Encounter: Payer: Self-pay | Admitting: Family Medicine

## 2023-03-14 VITALS — BP 111/67 | HR 73 | Ht 63.0 in | Wt 132.0 lb

## 2023-03-14 DIAGNOSIS — F900 Attention-deficit hyperactivity disorder, predominantly inattentive type: Secondary | ICD-10-CM

## 2023-03-14 DIAGNOSIS — F39 Unspecified mood [affective] disorder: Secondary | ICD-10-CM

## 2023-03-14 DIAGNOSIS — G43101 Migraine with aura, not intractable, with status migrainosus: Secondary | ICD-10-CM | POA: Diagnosis not present

## 2023-03-14 MED ORDER — RIZATRIPTAN BENZOATE 10 MG PO TABS: 10.0000 mg | ORAL_TABLET | ORAL | 6 refills | Status: DC | PRN

## 2023-03-14 NOTE — Assessment & Plan Note (Signed)
 Chronic, stable  ADHD, currently on Concerta (methylphenidate) 27 mg daily. Reports well-managed ADHD symptoms with current regimen. Prescription typically written for 90 tablets with a 90-day supply due to insurance requirements. - Refill Concerta (methylphenidate) 27 mg daily at the end of the month

## 2023-03-14 NOTE — Assessment & Plan Note (Signed)
 Chronic, stable  Reports feeling sad and stressed due to recent separation and ongoing custody issues. Currently on Zoloft 50 mg daily, cut from a 100 mg tablet. Seeing a therapist and recently transitioned to a new therapist. Denies needing additional medication for mood at this time, managing reasonably well. Prefers to continue current management without additional medication. - Continue Zoloft 50 mg daily - Continue therapy sessions

## 2023-03-14 NOTE — Assessment & Plan Note (Signed)
 Presents for follow-up on chronic migraines. Previously evaluated by neurology and currently on Maxalt, Topamax, ZeniFlex, Zofran, and Qulipta. Reports well-managed migraines with current regimen. Needs refills for Maxalt 10 mg as needed and ZeniFlex. Also taking magnesium as recommended by Dr. Clelia Croft. Comfortable with current medication management and prefers to continue as is. - Refill Maxalt 10 mg as needed - Refill ZeniFlex - Continue current medications including Topamax, Zofran, and Qulipta - Continue magnesium supplementation

## 2023-03-14 NOTE — Progress Notes (Signed)
 Established patient visit   Patient: Courtney Wyatt   DOB: 1985/08/09   38 y.o. Female  MRN: [id number] Visit Date: 03/14/2023  Today's healthcare provider: Ronnald Ramp, MD   Chief Complaint  Patient presents with   Establish Care    Needs meds refilled    Subjective     HPI     Establish Care    Additional comments: Needs meds refilled       Last edited by Thedora Hinders, CMA on 03/14/2023  8:32 AM.       Discussed the use of AI scribe software for clinical note transcription with the patient, who gave verbal consent to proceed.  History of Present Illness   The patient is a 38 year old with chronic migraines who presents for medication refills.  She is transitioning her migraine care from a neurologist in [place] to her primary care provider. Her migraine management includes Maxalt, Topamax, Zanaflex, Zofran, and Qulipta 60 mg. She needs a refill for Zanaflex, has sufficient Zofran, and believes her Topamax is covered until November. Her migraines have improved with magnesium supplementation.  She has a history of ADHD, managed with Concerta 27 mg daily, typically prescribed for 90 days due to insurance requirements. She manages refills by messaging her provider as needed.  She is on Zoloft 50 mg daily, which she cuts from a 100 mg tablet. Her mood has been affected by personal stressors, including a separation and custody issues. She is utilizing therapy through an employee assistance program and has transitioned to a new therapist. She occasionally uses melatonin and Unisom for sleep but experiences a 'hangover effect' the next day. Despite these challenges, she maintains a routine of decent eating and exercise.       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   magnesium oxide (MAG-OX) 400 (240 Mg) MG tablet Take 400 mg by mouth daily.   methylphenidate 27 MG PO CR tablet Take 1 tablet (27 mg total) by mouth every morning.   ondansetron (ZOFRAN) 4 MG tablet Take 1 tablet (4 mg total) by mouth every 8 (eight) hours as needed for nausea or vomiting.   sertraline (ZOLOFT) 100 MG tablet Take 1 tablet (100 mg total) by mouth daily. (Patient taking differently: Take 50 mg by mouth daily.)   tiZANidine (ZANAFLEX) 4 MG tablet Take 1 tablet (4 mg total) by mouth every 6 (six) hours as needed for muscle spasms.   topiramate (TOPAMAX) 100 MG tablet Take 1 tablet (100 mg total) by mouth at bedtime.   [DISCONTINUED] rizatriptan (MAXALT) 10 MG tablet Take 1 tablet (10 mg total) by mouth as needed. May repeat in 2 hours if needed   omeprazole (PRILOSEC) 40 MG capsule Take 1 capsule (40 mg total) by mouth 2 times daily  at 12 noon and 4 pm.   No facility-administered medications prior to visit.    Review of Systems  Last CBC Lab Results  Component Value Date   WBC 6.3 10/25/2022   HGB 13.6 10/25/2022   HCT 42.4 10/25/2022   MCV 92 10/25/2022   MCH 29.4 10/25/2022   RDW 12.0 10/25/2022   PLT 335 10/25/2022   Last metabolic panel Lab Results  Component Value Date   GLUCOSE 88 10/25/2022   NA 140 10/25/2022   K 4.2 10/25/2022   CL 107 (H) 10/25/2022   CO2 19 (L) 10/25/2022   BUN 15 10/25/2022   CREATININE 0.83 10/25/2022   EGFR 94 10/25/2022   CALCIUM 10.4 (H) 10/25/2022   PROT 6.9 10/25/2022   ALBUMIN 4.7 10/25/2022   LABGLOB 2.2 10/25/2022   AGRATIO 1.8  06/25/2020   BILITOT 0.6 10/25/2022   ALKPHOS 47 10/25/2022   AST 18 10/25/2022   ALT 16 10/25/2022   ANIONGAP 12 03/15/2015   Last lipids Lab Results  Component Value Date   CHOL 160 03/30/2022   HDL 64 03/30/2022   LDLCALC 85 03/30/2022   TRIG 54 03/30/2022   Last hemoglobin A1c Lab Results  Component Value Date   HGBA1C 5.2 03/30/2022   Last thyroid functions Lab Results  Component Value Date   TSH 0.576 10/25/2022        Objective    BP 111/67   Pulse 73   Ht 5\' 3"  (1.6 m)   Wt 132 lb (59.9 kg)   SpO2 99%   BMI 23.38 kg/m  BP Readings from Last 3 Encounters:  03/14/23 111/67  11/25/22 112/74  10/27/22 (!) 99/58   Wt Readings from Last 3 Encounters:  03/14/23 132 lb (59.9 kg)  11/25/22 130 lb (59 kg)  10/27/22 134 lb (60.8 kg)        Physical Exam  General: Alert, no acute distress Cardio: Normal S1 and S2, RRR, no r/m/g Pulm: CTAB, normal work of breathing   No results found for any visits on 03/14/23.  Assessment & Plan     Problem List Items Addressed This Visit       Cardiovascular and Mediastinum   Migraine with aura and with status migrainosus, not intractable   Presents for follow-up on chronic migraines. Previously evaluated by neurology and currently on Maxalt, Topamax, ZeniFlex, Zofran, and Qulipta. Reports well-managed migraines with current regimen. Needs refills for Maxalt 10 mg as needed and ZeniFlex. Also taking magnesium as recommended by Dr. Clelia Croft. Comfortable with current medication management and prefers to continue as is. - Refill Maxalt 10 mg as needed - Refill ZeniFlex - Continue current medications including Topamax, Zofran, and Qulipta - Continue magnesium supplementation      Relevant Medications   rizatriptan (MAXALT) 10 MG tablet     Other   Attention deficit hyperactivity disorder (ADHD) - Primary   Chronic, stable  ADHD, currently on Concerta (methylphenidate) 27 mg daily. Reports well-managed ADHD symptoms  with current regimen. Prescription typically written for 90 tablets with a 90-day supply due to insurance requirements. - Refill Concerta (methylphenidate) 27 mg daily at the end of the month              General Health Maintenance Due for a physical examination. Upcoming GYN appointment for pelvic floor issues, has not seen a GYN since delivering her daughter three years ago. - Schedule physical examination in July - Perform comprehensive exam and screening labs during physical   Return  in about 4 months (around 07/14/2023) for CPE.       Ronnald Ramp, MD  [facility] [phone] (phone) [phone] (fax)  [facility]

## 2023-04-13 ENCOUNTER — Encounter: Payer: Self-pay | Admitting: Family Medicine

## 2023-04-14 NOTE — Telephone Encounter (Signed)
 Please see the message below from the pt and advise

## 2023-04-15 ENCOUNTER — Other Ambulatory Visit: Payer: Self-pay | Admitting: Family Medicine

## 2023-04-15 DIAGNOSIS — G43101 Migraine with aura, not intractable, with status migrainosus: Secondary | ICD-10-CM

## 2023-04-15 MED ORDER — RIZATRIPTAN BENZOATE 10 MG PO TBDP
10.0000 mg | ORAL_TABLET | ORAL | 3 refills | Status: DC | PRN
Start: 2023-04-15 — End: 2023-06-13

## 2023-04-15 NOTE — Progress Notes (Signed)
 Maxalt 10mg  tablets [facility] at [id number]. Pt sent MyChart message requesting change to ODT that is available at the pharmacy.   ODT version sent to pharmacy to treat patient's migraine HA

## 2023-05-11 ENCOUNTER — Encounter: Payer: Self-pay | Admitting: Family Medicine

## 2023-05-11 ENCOUNTER — Ambulatory Visit: Admitting: Family Medicine

## 2023-05-11 VITALS — BP 120/66 | HR 72 | Resp 16 | Ht 63.0 in | Wt 131.0 lb

## 2023-05-11 DIAGNOSIS — T3695XA Adverse effect of unspecified systemic antibiotic, initial encounter: Secondary | ICD-10-CM

## 2023-05-11 DIAGNOSIS — B379 Candidiasis, unspecified: Secondary | ICD-10-CM

## 2023-05-11 DIAGNOSIS — R300 Dysuria: Secondary | ICD-10-CM | POA: Diagnosis not present

## 2023-05-11 LAB — POCT URINALYSIS DIPSTICK
Appearance: NORMAL
Bilirubin, UA: NEGATIVE
Blood, UA: NEGATIVE
Glucose, UA: NEGATIVE
Ketones, UA: NEGATIVE
Leukocytes, UA: NEGATIVE
Nitrite, UA: NEGATIVE
Protein, UA: NEGATIVE
Spec Grav, UA: 1.01 (ref 1.010–1.025)
Urobilinogen, UA: 0.2 U/dL
pH, UA: 7.5 (ref 5.0–8.0)

## 2023-05-11 MED ORDER — CEPHALEXIN 500 MG PO CAPS: 500.0000 mg | ORAL_CAPSULE | Freq: Four times a day (QID) | ORAL | 0 refills | Status: AC

## 2023-05-11 MED ORDER — FLUCONAZOLE 150 MG PO TABS: 150.0000 mg | ORAL_TABLET | ORAL | 2 refills | Status: DC | PRN

## 2023-05-11 NOTE — Progress Notes (Signed)
 Patient ID: Courtney Wyatt, female    DOB: 1985/04/14, 38 y.o.   MRN: [id number]  PCP: Courtney Alt, MD  Chief Complaint  Patient presents with   Urinary Tract Infection    Dysuria/frequency x2 days.     Subjective:   Courtney Wyatt is a 38 y.o. female, presents to clinic with CC of the following:  HPI  Here with 2 d of worsening dysuria, frequency, urgency, urine odor, no abd pain, F, N, V, flank pain, vaginal sx Hx of IC years ago, but she states this feels like a UTI New sexual partner but been together for 6 months, did STD screening already, low risk, but is having sex more and many years ago with similar sexual frequency she was prone to UTI after sex      Patient Active Problem List   Diagnosis Date Noted   Migraine with aura and with status migrainosus, not intractable 11/25/2022   Mood disorder (HCC) 07/22/2022   Annual physical exam 01/19/2022   Excessive sweating 01/19/2022   Attention deficit hyperactivity disorder (ADHD) 12/16/2020   Anxiety 07/24/2015   H/O suicide attempt 07/24/2015      Current Outpatient Medications:    Atogepant  (QULIPTA ) 60 MG TABS, Take 1 tablet (60 mg total) by mouth daily., Disp: 90 tablet, Rfl: 3   magnesium oxide (MAG-OX) 400 (240 Mg) MG tablet, Take 400 mg by mouth daily., Disp: , Rfl:    methylphenidate  27 MG PO CR tablet, Take 1 tablet (27 mg total) by mouth every morning., Disp: 90 tablet, Rfl: 0   ondansetron  (ZOFRAN ) 4 MG tablet, Take 1 tablet (4 mg total) by mouth every 8 (eight) hours as needed for nausea or vomiting., Disp: 20 tablet, Rfl: 6   rizatriptan  (MAXALT -MLT) 10 MG disintegrating tablet, Take 1 tablet (10 mg total) by mouth as needed for migraine. May repeat in 2 hours if needed, Disp: 10 tablet, Rfl: 3   sertraline  (ZOLOFT ) 100 MG tablet, Take 1 tablet (100 mg total) by mouth daily. (Patient taking differently: Take 50 mg by mouth daily.), Disp: 100 tablet, Rfl: 3   tiZANidine  (ZANAFLEX ) 4 MG tablet,  Take 1 tablet (4 mg total) by mouth every 6 (six) hours as needed for muscle spasms., Disp: 30 tablet, Rfl: 6   topiramate  (TOPAMAX ) 100 MG tablet, Take 1 tablet (100 mg total) by mouth at bedtime., Disp: 90 tablet, Rfl: 3   No Known Allergies   Social History   Tobacco Use   Smoking status: Former    Current packs/day: 0.00    Average packs/day: 0.5 packs/day for 5.0 years (2.5 ttl pk-yrs)    Types: Cigarettes    Start date: 10/21/2010    Quit date: 10/21/2015    Years since quitting: 7.5   Smokeless tobacco: Never  Vaping Use   Vaping status: Never Used  Substance Use Topics   Alcohol use: Not Currently    Comment: occ   Drug use: No      Chart Review Today: I personally reviewed active problem list, medication list, allergies, family history, social history, health maintenance, notes from last encounter, lab results, imaging with the patient/caregiver today.   Review of Systems  Constitutional: Negative.   HENT: Negative.    Eyes: Negative.   Respiratory: Negative.    Cardiovascular: Negative.   Gastrointestinal: Negative.   Endocrine: Negative.   Genitourinary: Negative.   Musculoskeletal: Negative.   Skin: Negative.   Allergic/Immunologic: Negative.   Neurological: Negative.  Hematological: Negative.   Psychiatric/Behavioral: Negative.    All other systems reviewed and are negative.      Objective:   Vitals:   05/11/23 1327  BP: 120/66  Pulse: 72  Resp: 16  SpO2: 97%  Weight: 131 lb (59.4 kg)  Height: 5\' 3"  (1.6 m)    Body mass index is 23.21 kg/m.  Physical Exam Vitals and nursing note reviewed.  Constitutional:      General: She is not in acute distress.    Appearance: Normal appearance. She is well-developed. She is not ill-appearing, toxic-appearing or diaphoretic.  HENT:     Head: Normocephalic and atraumatic.     Nose: Nose normal.  Eyes:     General:        Right eye: No discharge.        Left eye: No discharge.      Conjunctiva/sclera: Conjunctivae normal.  Neck:     Trachea: No tracheal deviation.  Cardiovascular:     Rate and Rhythm: Normal rate and regular rhythm.     Pulses: Normal pulses.     Heart sounds: Normal heart sounds.  Pulmonary:     Effort: Pulmonary effort is normal. No respiratory distress.     Breath sounds: No stridor.  Abdominal:     General: Bowel sounds are normal.     Palpations: Abdomen is soft.     Tenderness: There is no abdominal tenderness. There is no right CVA tenderness, left CVA tenderness, guarding or rebound.  Musculoskeletal:        General: Normal range of motion.  Skin:    General: Skin is warm and dry.     Findings: No rash.  Neurological:     Mental Status: She is alert.     Motor: No abnormal muscle tone.     Coordination: Coordination normal.  Psychiatric:        Behavior: Behavior normal.      Results for orders placed or performed in visit on 05/11/23  POCT urinalysis dipstick   Collection Time: 05/11/23  1:31 PM  Result Value Ref Range   Color, UA Yellow    Clarity, UA Cloudy    Glucose, UA Negative Negative   Bilirubin, UA Negative    Ketones, UA Negative    Spec Grav, UA 1.010 1.010 - 1.025   Blood, UA Negative    pH, UA 7.5 5.0 - 8.0   Protein, UA Negative Negative   Urobilinogen, UA 0.2 0.2 or 1.0 E.U./dL   Nitrite, UA Negative    Leukocytes, UA Negative Negative   Appearance Normal    Odor Slight        Assessment & Plan:   1. Dysuria (Primary) Sx consistent with UTI, pt prefers to start empiric abx coverage Hx of UTI with coitus many years ago, feels similar, discussed possible need for prophylactic abx for after sex, she also has hx of IC - culture added - Urine Culture - POCT urinalysis dipstick - cephALEXin  (KEFLEX ) 500 MG capsule; Take 1 capsule (500 mg total) by mouth 4 (four) times daily for 5 days.  Dispense: 20 capsule; Refill: 0  2. Antibiotic-induced yeast infection  - fluconazole  (DIFLUCAN ) 150 MG tablet;  Take 1 tablet (150 mg total) by mouth every 3 (three) days as needed (for vaginal itching/yeast infection sx).  Dispense: 2 tablet; Refill: 2  F/up if not improving  Pt interested in est here for care her PCP recently left her other practice - explained she can do  a visit to transfer to be a new pt, she can also send me mychart msg with q's about this visit or results  Pt VSS, abd exam unremarkable, tx for simple UTI   Adeline Hone, PA-C 05/11/23 1:32 PM

## 2023-05-13 LAB — URINE CULTURE
MICRO NUMBER:: 16426096
SPECIMEN QUALITY:: ADEQUATE

## 2023-05-16 ENCOUNTER — Encounter: Payer: Self-pay | Admitting: Family Medicine

## 2023-05-23 ENCOUNTER — Encounter: Payer: Self-pay | Admitting: Family Medicine

## 2023-05-25 ENCOUNTER — Other Ambulatory Visit: Payer: Self-pay

## 2023-05-25 MED ORDER — METHYLPHENIDATE HCL ER (OSM) 27 MG PO TBCR: 27.0000 mg | EXTENDED_RELEASE_TABLET | ORAL | 0 refills | Status: DC

## 2023-05-25 NOTE — Addendum Note (Signed)
 Addended by: SIMMONS-ROBINSON, Mykale Gandolfo L on: 05/25/2023 09:29 AM   Modules accepted: Orders

## 2023-06-13 ENCOUNTER — Ambulatory Visit (INDEPENDENT_AMBULATORY_CARE_PROVIDER_SITE_OTHER): Admitting: Family Medicine

## 2023-06-13 ENCOUNTER — Encounter: Payer: Self-pay | Admitting: Family Medicine

## 2023-06-13 VITALS — BP 118/68 | HR 76 | Resp 16 | Ht 63.0 in | Wt 125.0 lb

## 2023-06-13 DIAGNOSIS — Z7689 Persons encountering health services in other specified circumstances: Secondary | ICD-10-CM

## 2023-06-13 DIAGNOSIS — Z792 Long term (current) use of antibiotics: Secondary | ICD-10-CM

## 2023-06-13 DIAGNOSIS — M542 Cervicalgia: Secondary | ICD-10-CM

## 2023-06-13 DIAGNOSIS — F900 Attention-deficit hyperactivity disorder, predominantly inattentive type: Secondary | ICD-10-CM

## 2023-06-13 DIAGNOSIS — F419 Anxiety disorder, unspecified: Secondary | ICD-10-CM

## 2023-06-13 DIAGNOSIS — F39 Unspecified mood [affective] disorder: Secondary | ICD-10-CM | POA: Diagnosis not present

## 2023-06-13 DIAGNOSIS — G43101 Migraine with aura, not intractable, with status migrainosus: Secondary | ICD-10-CM | POA: Diagnosis not present

## 2023-06-13 DIAGNOSIS — N390 Urinary tract infection, site not specified: Secondary | ICD-10-CM

## 2023-06-13 DIAGNOSIS — F331 Major depressive disorder, recurrent, moderate: Secondary | ICD-10-CM

## 2023-06-13 DIAGNOSIS — F430 Acute stress reaction: Secondary | ICD-10-CM

## 2023-06-13 MED ORDER — QULIPTA 60 MG PO TABS: 60.0000 mg | ORAL_TABLET | Freq: Every day | ORAL | 3 refills | Status: AC

## 2023-06-13 MED ORDER — CEPHALEXIN 500 MG PO CAPS: 500.0000 mg | ORAL_CAPSULE | Freq: Every day | ORAL | 5 refills | Status: AC | PRN

## 2023-06-13 MED ORDER — ONDANSETRON HCL 4 MG PO TABS: 4.0000 mg | ORAL_TABLET | Freq: Three times a day (TID) | ORAL | 6 refills | Status: AC | PRN

## 2023-06-13 MED ORDER — SERTRALINE HCL 50 MG PO TABS
75.0000 mg | ORAL_TABLET | Freq: Every day | ORAL | 1 refills | Status: DC
Start: 2023-06-13 — End: 2023-07-12

## 2023-06-13 MED ORDER — RIZATRIPTAN BENZOATE 10 MG PO TBDP: 10.0000 mg | ORAL_TABLET | ORAL | 3 refills | Status: DC | PRN

## 2023-06-13 MED ORDER — BUPROPION HCL ER (XL) 150 MG PO TB24: 150.0000 mg | ORAL_TABLET | Freq: Every day | ORAL | 1 refills | Status: DC

## 2023-06-13 MED ORDER — TIZANIDINE HCL 4 MG PO TABS: 4.0000 mg | ORAL_TABLET | Freq: Four times a day (QID) | ORAL | 5 refills | Status: DC | PRN

## 2023-06-13 MED ORDER — TOPIRAMATE 100 MG PO TABS: 100.0000 mg | ORAL_TABLET | Freq: Every evening | ORAL | 3 refills | Status: AC

## 2023-06-13 NOTE — Progress Notes (Signed)
 Name: Courtney Wyatt   MRN: [id number]    DOB: 1985/10/29   Date:06/13/2023       Progress Note  Chief Complaint  Patient presents with   Establish Care   Depression    Would like psychiatry referral. Going through a divorce   Anxiety     Subjective:   Courtney Wyatt is a 38 y.o. female, presents to clinic to est care from other practice   Recent increase in UTIs with being more sexually active, this was a problem many years ago as well Post-coital prophylactic abx discussed - she's interested in this Also gets yeast infections after abx  Hx of anxiety/mood disorder on zoloft  from primary care - things worse with divorce proceedings getting worse, her mood is worse, she has tried higher zoloft  dose before but SE bothersome.  Currently on 50 mg In the past was on lexapro as a teen She is not having anxiety attacks    06/13/2023    2:23 PM 03/14/2023    8:36 AM 07/22/2022    2:49 PM  Depression screen PHQ 2/9  Decreased Interest 3 0 0  Down, Depressed, Hopeless 3 3 0  PHQ - 2 Score 6 3 0  Altered sleeping 3 3 1   Tired, decreased energy 3 3 1   Change in appetite 3 2 1   Feeling bad or failure about yourself  3 3 0  Trouble concentrating 3 3 1   Moving slowly or fidgety/restless 0 2 0  Suicidal thoughts 0 0 0  PHQ-9 Score 21 19 4   Difficult doing work/chores  Somewhat difficult Not difficult at all   GAD 7 positive  - see below    06/13/2023    2:24 PM 03/14/2023    8:37 AM  GAD 7 : Generalized Anxiety Score  Nervous, Anxious, on Edge 3 3  Control/stop worrying 3 2  Worry too much - different things 3 2  Trouble relaxing 3 3  Restless 3 2  Easily annoyed or irritable 3 3  Afraid - awful might happen 3 1  Total GAD 7 Score 21 16   Last pcp visit mood was worse due to acute stressors: She is on Zoloft  50 mg daily, which she cuts from a 100 mg tablet. Her mood has been affected by personal stressors, including a separation and custody issues. She is utilizing therapy through  an employee assistance program and has transitioned to a new therapist. She occasionally uses melatonin and Unisom  for sleep but experiences a 'hangover effect' the next day. Despite these challenges, she maintains a routine of decent eating and exercise.  Did talk to three different therapist [facility] and then 2 through employee assistance and she would like psychiatry female and therapy   ADHD also well documented and was managed by her prior primary care was on methylphenidate   Pdmp reviewed gets #90 supply which last longer than 90 days, on stable dose ER 27 mg  Hx of migraines/HA's was managed by specialist - neurology on quilipta, topimax and muscle relaxers and zofran      Current Outpatient Medications:    Atogepant  (QULIPTA ) 60 MG TABS, Take 1 tablet (60 mg total) by mouth daily., Disp: 90 tablet, Rfl: 3   fluconazole  (DIFLUCAN ) 150 MG tablet, Take 1 tablet (150 mg total) by mouth every 3 (three) days as needed (for vaginal itching/yeast infection sx)., Disp: 2 tablet, Rfl: 2   magnesium oxide (MAG-OX) 400 (240 Mg) MG tablet, Take 400 mg by mouth daily.,  Disp: , Rfl:    methylphenidate  27 MG PO CR tablet, Take 1 tablet (27 mg total) by mouth every morning., Disp: 90 tablet, Rfl: 0   ondansetron  (ZOFRAN ) 4 MG tablet, Take 1 tablet (4 mg total) by mouth every 8 (eight) hours as needed for nausea or vomiting., Disp: 20 tablet, Rfl: 6   rizatriptan  (MAXALT -MLT) 10 MG disintegrating tablet, Take 1 tablet (10 mg total) by mouth as needed for migraine. May repeat in 2 hours if needed, Disp: 10 tablet, Rfl: 3   sertraline  (ZOLOFT ) 100 MG tablet, Take 1 tablet (100 mg total) by mouth daily. (Patient taking differently: Take 50 mg by mouth daily.), Disp: 100 tablet, Rfl: 3   tiZANidine  (ZANAFLEX ) 4 MG tablet, Take 1 tablet (4 mg total) by mouth every 6 (six) hours as needed for muscle spasms., Disp: 30 tablet, Rfl: 6   topiramate  (TOPAMAX ) 100 MG tablet, Take 1 tablet (100 mg total) by mouth  at bedtime., Disp: 90 tablet, Rfl: 3  Patient Active Problem List   Diagnosis Date Noted   Migraine with aura and with status migrainosus, not intractable 11/25/2022   Mood disorder (HCC) 07/22/2022   Annual physical exam 01/19/2022   Excessive sweating 01/19/2022   Attention deficit hyperactivity disorder (ADHD) 12/16/2020   Anxiety 07/24/2015   H/O suicide attempt 07/24/2015    Past Surgical History:  Procedure Laterality Date   CESAREAN SECTION N/A 08/25/2017   Procedure: CESAREAN SECTION;  Surgeon: Woodrow Hazy, MD;  Location: [facility];  Service: Obstetrics;  Laterality: N/A;   LAPAROSCOPY  04/2004   MASS EXCISION Left 10/27/2022   Procedure: EXCISION OF LOWER LIP MUCOCELE;  Surgeon: Rogers Clayman, MD;  Location: [facility];  Service: ENT;  Laterality: Left;   TONSILLECTOMY  1994   DR.  Meyer Ada   WISDOM TOOTH EXTRACTION      Family History  Problem Relation Age of Onset   Hyperlipidemia Mother    Depression Mother    Hypertension Mother    Hypertension Father    Diabetes Father    Breast cancer Maternal Aunt 65   Lung cancer Maternal Grandmother     Social History   Tobacco Use   Smoking status: Former    Current packs/day: 0.00    Average packs/day: 0.5 packs/day for 5.0 years (2.5 ttl pk-yrs)    Types: Cigarettes    Start date: 10/21/2010    Quit date: 10/21/2015    Years since quitting: 7.6   Smokeless tobacco: Never  Vaping Use   Vaping status: Never Used  Substance Use Topics   Alcohol use: Not Currently    Comment: occ   Drug use: No     No Known Allergies  Health Maintenance  Topic Date Due   Hepatitis C Screening  Never done   COVID-19 Vaccine (3 - 2024-25 season) 09/05/2022   INFLUENZA VACCINE  08/05/2023   Cervical Cancer Screening (HPV/Pap Cotest)  01/19/2025   DTaP/Tdap/Td (10 - Td or Tdap) 04/16/2029   HIV Screening  Completed   HPV VACCINES  Aged Out   Meningococcal B Vaccine  Aged Out    Chart Review  Today: I personally reviewed active problem list, medication list, allergies, family history, social history, health maintenance, notes from last encounter, lab results, imaging with the patient/caregiver today.   Review of Systems  Constitutional: Negative.   HENT: Negative.    Eyes: Negative.   Respiratory: Negative.    Cardiovascular: Negative.   Gastrointestinal: Negative.   Endocrine:  Negative.   Genitourinary: Negative.   Musculoskeletal: Negative.   Skin: Negative.   Allergic/Immunologic: Negative.   Neurological: Negative.   Hematological: Negative.   Psychiatric/Behavioral: Negative.    All other systems reviewed and are negative.    Objective:   Vitals:   06/13/23 1419  BP: 118/68  Pulse: 76  Resp: 16  SpO2: 99%  Weight: 125 lb (56.7 kg)  Height: 5\' 3"  (1.6 m)    Body mass index is 22.14 kg/m.  Physical Exam Vitals and nursing note reviewed.  Constitutional:      General: She is not in acute distress.    Appearance: Normal appearance. She is well-developed. She is not ill-appearing, toxic-appearing or diaphoretic.  HENT:     Head: Normocephalic and atraumatic.     Right Ear: External ear normal.     Left Ear: External ear normal.     Nose: Nose normal.  Eyes:     General: No scleral icterus.       Right eye: No discharge.        Left eye: No discharge.     Conjunctiva/sclera: Conjunctivae normal.  Neck:     Trachea: No tracheal deviation.  Cardiovascular:     Rate and Rhythm: Normal rate.  Pulmonary:     Effort: Pulmonary effort is normal.  Skin:    General: Skin is warm and dry.     Findings: No rash.  Neurological:     Mental Status: She is alert.     Motor: No abnormal muscle tone.     Coordination: Coordination normal.     Gait: Gait normal.  Psychiatric:        Mood and Affect: Affect is tearful.        Behavior: Behavior normal.        Thought Content: Thought content does not include homicidal or suicidal ideation. Thought content  does not include homicidal or suicidal plan.            Assessment & Plan:   Problem List Items Addressed This Visit     Anxiety   Worse anxiety sx Plan to est with psych office with therpists Try to increase zoloft  dose to 75 and then add wellbutrin      Relevant Medications   buPROPion (WELLBUTRIN XL) 150 MG 24 hr tablet   sertraline  (ZOLOFT ) 50 MG tablet   Attention deficit hyperactivity disorder (ADHD)   On stable dosing, PDMP reviewed Will do next #90 refill      Mood disorder (HCC)   Worse sx with recent stressors Phq 9 and GAD 7 highly positive No SI, HI, AVH       Relevant Medications   buPROPion (WELLBUTRIN XL) 150 MG 24 hr tablet   sertraline  (ZOLOFT ) 50 MG tablet   Other Relevant Orders   Ambulatory referral to Psychiatry   Migraine with aura and with status migrainosus, not intractable   Current frequency of migraines fairly stable on her current meds No longer seeing neurology, so PCP will take over all meds, currently on qulipta  and topamax  daily, uses maxalt /zalaflex/zofran  with worse migraines, right now breakthrough HA's using OTC meds to manage      Relevant Medications         Atogepant  (QULIPTA ) 60 MG TABS   ondansetron  (ZOFRAN ) 4 MG tablet   rizatriptan  (MAXALT -MLT) 10 MG disintegrating tablet   tiZANidine  (ZANAFLEX ) 4 MG tablet   topiramate  (TOPAMAX ) 100 MG tablet   Other Visit Diagnoses  Encounter to establish care with new doctor    -  Primary   tranferring care from other practice      Recurrent UTI       occurs freq with sex, trial of prophylactic abx for use after sex - keflex  500 mg PRN   Relevant Medications   cephALEXin  (KEFLEX ) 500 MG capsule     Moderate episode of recurrent major depressive disorder (HCC)       Relevant Medications   buPROPion (WELLBUTRIN XL) 150 MG 24 hr tablet   sertraline  (ZOLOFT ) 50 MG tablet   Other Relevant Orders   Ambulatory referral to Psychiatry     Acute stress reaction       Relevant  Medications   Divorce and legal proceedings is worsening anxiety and depression sx, she really wants to est with therapist or psychologist/psychiatrist to help her identify things she can improve on for herself and family and future relationships Discussed referrals and trial of med dose changes and new wellbutrin - will do close f/up with CPE next month     buPROPion (WELLBUTRIN XL) 150 MG 24 hr tablet   sertraline  (ZOLOFT ) 50 MG tablet   Other Relevant Orders   Ambulatory referral to Psychiatry     Prophylactic antibiotic       Relevant Medications   cephALEXin  (KEFLEX ) 500 MG capsule     Neck pain without injury       refill on muscle relaxers for when neck pain triggers HA/migraines   Relevant Medications   tiZANidine  (ZANAFLEX ) 4 MG tablet       Keep CPE in July   Adeline Hone, PA-C 06/13/23 12:03 PM

## 2023-06-13 NOTE — Assessment & Plan Note (Signed)
 On stable dosing, PDMP reviewed Will do next #90 refill

## 2023-06-13 NOTE — Assessment & Plan Note (Signed)
 Worse anxiety sx Plan to est with psych office with therpists Try to increase zoloft  dose to 75 and then add wellbutrin

## 2023-06-13 NOTE — Assessment & Plan Note (Signed)
 Current frequency of migraines fairly stable on her current meds No longer seeing neurology, so PCP will take over all meds, currently on qulipta  and topamax  daily, uses maxalt /zalaflex/zofran  with worse migraines, right now breakthrough HA's using OTC meds to manage

## 2023-06-13 NOTE — Assessment & Plan Note (Signed)
 Worse sx with recent stressors Phq 9 and GAD 7 highly positive No SI, HI, AVH

## 2023-06-13 NOTE — Patient Instructions (Addendum)
 [address] [place], [place] [address] Phone Number: [phone]  Fax Number: [phone] [email or web address]   Call to make an appointment   Try increasing the zoloft  first to 75 mg and then add wellbutrin

## 2023-06-23 ENCOUNTER — Encounter: Payer: Self-pay | Admitting: Family Medicine

## 2023-07-01 ENCOUNTER — Encounter: Payer: Self-pay | Admitting: Family Medicine

## 2023-07-01 DIAGNOSIS — F900 Attention-deficit hyperactivity disorder, predominantly inattentive type: Secondary | ICD-10-CM

## 2023-07-01 DIAGNOSIS — F39 Unspecified mood [affective] disorder: Secondary | ICD-10-CM

## 2023-07-01 DIAGNOSIS — F331 Major depressive disorder, recurrent, moderate: Secondary | ICD-10-CM

## 2023-07-11 ENCOUNTER — Encounter: Payer: Self-pay | Admitting: Family Medicine

## 2023-07-11 DIAGNOSIS — F331 Major depressive disorder, recurrent, moderate: Secondary | ICD-10-CM

## 2023-07-11 DIAGNOSIS — F4323 Adjustment disorder with mixed anxiety and depressed mood: Secondary | ICD-10-CM

## 2023-07-11 DIAGNOSIS — F39 Unspecified mood [affective] disorder: Secondary | ICD-10-CM

## 2023-07-11 DIAGNOSIS — F430 Acute stress reaction: Secondary | ICD-10-CM

## 2023-07-11 DIAGNOSIS — F419 Anxiety disorder, unspecified: Secondary | ICD-10-CM

## 2023-07-12 MED ORDER — ESCITALOPRAM OXALATE 10 MG PO TABS: ORAL_TABLET | ORAL | 0 refills | Status: DC

## 2023-07-12 MED ORDER — SERTRALINE HCL 50 MG PO TABS: ORAL_TABLET | ORAL | Status: DC

## 2023-07-14 ENCOUNTER — Encounter: Admitting: Family Medicine

## 2023-08-19 ENCOUNTER — Encounter: Payer: Self-pay | Admitting: Family Medicine

## 2023-08-19 ENCOUNTER — Ambulatory Visit (INDEPENDENT_AMBULATORY_CARE_PROVIDER_SITE_OTHER): Admitting: Family Medicine

## 2023-08-19 VITALS — BP 122/68 | HR 104 | Resp 16 | Ht 63.0 in | Wt 128.0 lb

## 2023-08-19 DIAGNOSIS — F331 Major depressive disorder, recurrent, moderate: Secondary | ICD-10-CM

## 2023-08-19 DIAGNOSIS — F419 Anxiety disorder, unspecified: Secondary | ICD-10-CM | POA: Diagnosis not present

## 2023-08-19 DIAGNOSIS — E538 Deficiency of other specified B group vitamins: Secondary | ICD-10-CM

## 2023-08-19 DIAGNOSIS — Z0000 Encounter for general adult medical examination without abnormal findings: Secondary | ICD-10-CM | POA: Diagnosis not present

## 2023-08-19 DIAGNOSIS — Z1159 Encounter for screening for other viral diseases: Secondary | ICD-10-CM

## 2023-08-19 DIAGNOSIS — N390 Urinary tract infection, site not specified: Secondary | ICD-10-CM

## 2023-08-19 DIAGNOSIS — F39 Unspecified mood [affective] disorder: Secondary | ICD-10-CM

## 2023-08-19 DIAGNOSIS — F900 Attention-deficit hyperactivity disorder, predominantly inattentive type: Secondary | ICD-10-CM

## 2023-08-19 MED ORDER — METHYLPHENIDATE HCL ER (OSM) 27 MG PO TBCR: 27.0000 mg | EXTENDED_RELEASE_TABLET | ORAL | 0 refills | Status: DC

## 2023-08-19 NOTE — Progress Notes (Signed)
 Patient: Courtney Wyatt, Female    DOB: [birth year], 38 y.o.   MRN: [id number] Leavy Mole, PA-C Visit Date: 08/19/2023  Today's Provider: Mole Leavy, PA-C   Chief Complaint  Patient presents with   Annual Exam   Subjective:   Annual physical exam:  Courtney Wyatt is a 38 y.o. female who presents today for complete physical exam:  Exercise/Activity:  was exercising really well but circumstances have made her not be able to exercise as much and  Diet/nutrition:   see above Sleep:  not sleeping great   SDOH Screenings   Food Insecurity: No Food Insecurity (08/15/2023)  Housing: Low Risk  (08/15/2023)  Transportation Needs: No Transportation Needs (08/15/2023)  Utilities: Not At Risk (03/14/2023)  Alcohol Screen: Low Risk  (03/14/2023)  Depression (PHQ2-9): High Risk (08/19/2023)  Financial Resource Strain: Low Risk  (08/15/2023)  Physical Activity: Sufficiently Active (08/15/2023)  Social Connections: Moderately Isolated (08/15/2023)  Stress: Stress Concern Present (08/15/2023)  Tobacco Use: Medium Risk (08/19/2023)  Health Literacy: Adequate Health Literacy (03/14/2023)     USPSTF grade A and B recommendations - reviewed and addressed today  Depression:  Phq 9 completed today by patient, was reviewed by me with patient in the room PHQ score is positive, pt feels slightly better score and sx     08/19/2023    2:22 PM 06/13/2023    2:23 PM 03/14/2023    8:36 AM 07/22/2022    2:49 PM  PHQ 2/9 Scores  PHQ - 2 Score 2 6 3  0  PHQ- 9 Score 11 21 19 4       08/19/2023    2:22 PM 06/13/2023    2:23 PM 03/14/2023    8:36 AM 07/22/2022    2:49 PM 09/24/2021    1:45 PM  Depression screen PHQ 2/9  Decreased Interest 1 3 0 0 0  Down, Depressed, Hopeless 1 3 3  0 0  PHQ - 2 Score 2 6 3  0 0  Altered sleeping 2 3 3 1  0  Tired, decreased energy 2 3 3 1  0  Change in appetite  3 2 1  0  Feeling bad or failure about yourself  2 3 3  0 0  Trouble concentrating 3 3 3 1  0  Moving slowly or  fidgety/restless 0 0 2 0 0  Suicidal thoughts 0 0 0 0 0  PHQ-9 Score 11 21 19 4  0  Difficult doing work/chores   Somewhat difficult Not difficult at all Not difficult at all    Alcohol screening: Flowsheet Row Office Visit from 03/14/2023 in [facility]  AUDIT-C Score 0    Immunizations and Health Maintenance: Health Maintenance  Topic Date Due   Hepatitis C Screening  Never done   COVID-19 Vaccine (3 - 2024-25 season) 09/03/2023 (Originally 09/05/2022)   INFLUENZA VACCINE  04/03/2024 (Originally 08/05/2023)   HPV VACCINES (1 - 3-dose SCDM series) 08/17/2024 (Originally 01/24/2014)   Cervical Cancer Screening (HPV/Pap Cotest)  01/19/2025   DTaP/Tdap/Td (10 - Td or Tdap) 04/16/2029   HIV Screening  Completed   Pneumococcal Vaccine  Aged Out   Meningococcal B Vaccine  Aged Out     Hep C Screening: will try to add on today   STD testing and prevention (HIV/chl/gon/syphilis):  see above, no additional testing desired by pt today  Intimate partner violence: safe at home   Sexual History/Pain during Intercourse: Divorced  Menstrual History/LMP/Abnormal Bleeding:   No LMP recorded.  Incontinence Symptoms: ***  Breast cancer:  Last Mammogram: *see HM list above BRCA gene screening: ***  Cervical cancer screening: UTD    Osteoporosis:   Discussion on osteoporosis per age, including high calcium  and vitamin D supplementation, weight bearing exercises Pt is *** supplementing with daily calcium /Vit D. *** Bone scan/dexa Roughly experienced menopause at age ***  Skin cancer:  Hx of skin CA -  NO Sees dermatology - [facility]  Discussed atypical lesions   Colorectal cancer:   Colonoscopy is  Discussed concerning signs and sx of CRC, pt denies ***  Lung cancer:   Low Dose CT Chest recommended if Age 32-80 years, 20 pack-year currently smoking OR have quit w/in 15years. Patient {DOES NOT does:27190::does not} qualify.    Social History   Tobacco Use    Smoking status: Former    Current packs/day: 0.00    Average packs/day: 0.5 packs/day for 5.0 years (2.5 ttl pk-yrs)    Types: Cigarettes    Start date: 10/21/2010    Quit date: 10/21/2015    Years since quitting: 7.8   Smokeless tobacco: Never  Vaping Use   Vaping status: Former  Substance Use Topics   Alcohol use: Not Currently    Comment: occ   Drug use: No     Flowsheet Row Office Visit from 03/14/2023 in [facility]  AUDIT-C Score 0    Family History  Problem Relation Age of Onset   Hyperlipidemia Mother    Depression Mother    Hypertension Mother    Hypertension Father    Diabetes Father    Breast cancer Maternal Aunt 69   Lung cancer Maternal Grandmother      Blood pressure/Hypertension: BP Readings from Last 3 Encounters:  08/19/23 122/68  06/13/23 118/68  05/11/23 120/66    Weight/Obesity: Wt Readings from Last 3 Encounters:  08/19/23 128 lb (58.1 kg)  06/13/23 125 lb (56.7 kg)  05/11/23 131 lb (59.4 kg)   BMI Readings from Last 3 Encounters:  08/19/23 22.67 kg/m  06/13/23 22.14 kg/m  05/11/23 23.21 kg/m     Lipids:  Lab Results  Component Value Date   CHOL 160 03/30/2022   Lab Results  Component Value Date   HDL 64 03/30/2022   Lab Results  Component Value Date   LDLCALC 85 03/30/2022   Lab Results  Component Value Date   TRIG 54 03/30/2022   No results found for: CHOLHDL No results found for: LDLDIRECT Based on the results of lipid panel his/her cardiovascular risk factor ( using [place] Cohort )  in the next 10 years is: The ASCVD Risk score (Arnett DK, et al., 2019) failed to calculate for the following reasons:   The 2019 ASCVD risk score is only valid for ages 66 to 76  Glucose:  Glucose  Date Value Ref Range Status  10/25/2022 88 70 - 99 mg/dL Final  93/77/7977 76 65 - 99 mg/dL Final   Glucose, Bld  Date Value Ref Range Status  03/15/2015 111 (H) 65 - 99 mg/dL Final    Advanced Care  Planning:  A voluntary discussion about advance care planning including the explanation and discussion of advance directives.   Discussed health care proxy and Living will, and the patient was able to identify a health care proxy as ***.   Patient {DOES_DOES WNU:81435} have a living will at present time.   Social History       Social History   Socioeconomic History   Marital status: Divorced  Spouse name: Not on file   Number of children: 0   Years of education: Not on file   Highest education level: Doctorate  Occupational History   Not on file  Tobacco Use   Smoking status: Former    Current packs/day: 0.00    Average packs/day: 0.5 packs/day for 5.0 years (2.5 ttl pk-yrs)    Types: Cigarettes    Start date: 10/21/2010    Quit date: 10/21/2015    Years since quitting: 7.8   Smokeless tobacco: Never  Vaping Use   Vaping status: Former  Substance and Sexual Activity   Alcohol use: Not Currently    Comment: occ   Drug use: No   Sexual activity: Yes    Birth control/protection: None  Other Topics Concern   Not on file  Social History Narrative   Right handed   Drink coffee 2-3 times per day   Social Drivers of Health   Financial Resource Strain: Low Risk  (08/15/2023)   Overall Financial Resource Strain (CARDIA)    Difficulty of Paying Living Expenses: Not hard at all  Food Insecurity: No Food Insecurity (08/15/2023)   Hunger Vital Sign    Worried About Running Out of Food in the Last Year: Never true    Ran Out of Food in the Last Year: Never true  Transportation Needs: No Transportation Needs (08/15/2023)   PRAPARE - Administrator, Civil Service (Medical): No    Lack of Transportation (Non-Medical): No  Physical Activity: Sufficiently Active (08/15/2023)   Exercise Vital Sign    Days of Exercise per Week: 5 days    Minutes of Exercise per Session: 30 min  Stress: Stress Concern Present (08/15/2023)   [employer] of Occupational Health -  Occupational Stress Questionnaire    Feeling of Stress: Very much  Social Connections: Moderately Isolated (08/15/2023)   Social Connection and Isolation Panel    Frequency of Communication with Friends and Family: Twice a week    Frequency of Social Gatherings with Friends and Family: Three times a week    Attends Religious Services: More than 4 times per year    Active Member of Clubs or Organizations: No    Attends Engineer, structural: Not on file    Marital Status: Separated    Family History        Family History  Problem Relation Age of Onset   Hyperlipidemia Mother    Depression Mother    Hypertension Mother    Hypertension Father    Diabetes Father    Breast cancer Maternal Aunt 74   Lung cancer Maternal Grandmother     Patient Active Problem List   Diagnosis Date Noted   Migraine with aura and with status migrainosus, not intractable 11/25/2022   Mood disorder (HCC) 07/22/2022   Excessive sweating 01/19/2022   Attention deficit hyperactivity disorder (ADHD) 12/16/2020   Anxiety 07/24/2015    Past Surgical History:  Procedure Laterality Date   CESAREAN SECTION N/A 08/25/2017   Procedure: CESAREAN SECTION;  Surgeon: Johnnye Ade, MD;  [facility]: [facility];  Service: Obstetrics;  Laterality: N/A;   LAPAROSCOPY  04/2004   MASS EXCISION Left 10/27/2022   Procedure: EXCISION OF LOWER LIP MUCOCELE;  Surgeon: Milissa Hamming, MD;  Location: [facility];  Service: ENT;  Laterality: Left;   TONSILLECTOMY  1994   DR.  FOREST   WISDOM TOOTH EXTRACTION       Current Outpatient Medications:    Atogepant  (QULIPTA )  60 MG TABS, Take 1 tablet (60 mg total) by mouth daily., Disp: 90 tablet, Rfl: 3   buPROPion  (WELLBUTRIN  XL) 150 MG 24 hr tablet, Take 1 tablet (150 mg total) by mouth daily., Disp: 90 tablet, Rfl: 1   cephALEXin  (KEFLEX ) 500 MG capsule, Take 1 capsule (500 mg total) by mouth daily as needed (post coital dose for preventing UTI).,  Disp: 20 capsule, Rfl: 5   escitalopram  (LEXAPRO ) 10 MG tablet, Take 0.5 tablets (5 mg total) by mouth daily for 14 days, THEN 1 tablet (10 mg total) daily., Disp: 90 tablet, Rfl: 0   JUNEL FE 1/20 1-20 MG-MCG tablet, Take 1 tablet by mouth daily., Disp: , Rfl:    magnesium oxide (MAG-OX) 400 (240 Mg) MG tablet, Take 400 mg by mouth daily., Disp: , Rfl:    methylphenidate  27 MG PO CR tablet, Take 1 tablet (27 mg total) by mouth every morning., Disp: 90 tablet, Rfl: 0   ondansetron  (ZOFRAN ) 4 MG tablet, Take 1 tablet (4 mg total) by mouth every 8 (eight) hours as needed for nausea or vomiting., Disp: 20 tablet, Rfl: 6   rizatriptan  (MAXALT -MLT) 10 MG disintegrating tablet, Take 1 tablet (10 mg total) by mouth as needed for migraine. May repeat in 2 hours if needed, Disp: 10 tablet, Rfl: 3   tiZANidine  (ZANAFLEX ) 4 MG tablet, Take 1 tablet (4 mg total) by mouth every 6 (six) hours as needed for muscle spasms., Disp: 30 tablet, Rfl: 5   topiramate  (TOPAMAX ) 100 MG tablet, Take 1 tablet (100 mg total) by mouth at bedtime., Disp: 90 tablet, Rfl: 3   sertraline  (ZOLOFT ) 50 MG tablet, Take 1 tablet (50 mg total) by mouth daily for 7 days, THEN 0.5 tablets (25 mg total) daily for 7 days. (Patient not taking: No sig reported), Disp: , Rfl:   No Known Allergies  Patient Care Team: Leavy Mole, PA-C as PCP - General (Family Medicine)   Chart Review: ***  Review of Systems        Objective:   Vitals:  Vitals:   08/19/23 1412  BP: 122/68  Pulse: (!) 104  Resp: 16  SpO2: 98%  Weight: 128 lb (58.1 kg)  Height: 5' 3 (1.6 m)    Body mass index is 22.67 kg/m.  Physical Exam    Fall Risk:    08/19/2023    2:11 PM 06/13/2023    2:25 PM 07/22/2022    2:49 PM 09/24/2021    1:45 PM 10/31/2019    9:41 AM  Fall Risk   Falls in the past year? 0 0 0 0 0  Number falls in past yr: 0 0 0 0 0  Injury with Fall? 0 0 0 0 0  Risk for fall due to : No Fall Risks No Fall Risks No Fall Risks No Fall  Risks No Fall Risks  Follow up Falls prevention discussed Falls prevention discussed Falls evaluation completed Falls evaluation completed  Falls evaluation completed      Data saved with a previous flowsheet row definition    Functional Status Survey: Is the patient deaf or have difficulty hearing?: No Does the patient have difficulty seeing, even when wearing glasses/contacts?: No Does the patient have difficulty concentrating, remembering, or making decisions?: No Does the patient have difficulty walking or climbing stairs?: No Does the patient have difficulty dressing or bathing?: No Does the patient have difficulty doing errands alone such as visiting a doctor's office or shopping?: No   Assessment &  Plan:    CPE completed today  USPSTF grade A and B recommendations reviewed with patient; age-appropriate recommendations, preventive care, screening tests, etc discussed and encouraged; healthy living encouraged; see AVS for patient education given to patient  Discussed importance of 150 minutes of physical activity weekly, AHA exercise recommendations given to pt in AVS/handout  Discussed importance of healthy diet:  eating lean meats and proteins, avoiding trans fats and saturated fats, avoid simple sugars and excessive carbs in diet, eat 6 servings of fruit/vegetables daily and drink plenty of water  and avoid sweet beverages.    Recommended pt to do annual eye exam and routine dental exams/cleanings  Depression, alcohol, fall screening completed as documented above and per flowsheets  Advance Care planning information and packet discussed and offered today, encouraged pt to discuss with family members/spouse/partner/friends and complete Advanced directive packet and bring copy to office   Reviewed Health Maintenance: Health Maintenance  Topic Date Due   Hepatitis C Screening  Never done   COVID-19 Vaccine (3 - 2024-25 season) 09/03/2023 (Originally 09/05/2022)   INFLUENZA  VACCINE  04/03/2024 (Originally 08/05/2023)   HPV VACCINES (1 - 3-dose SCDM series) 08/17/2024 (Originally 01/24/2014)   Cervical Cancer Screening (HPV/Pap Cotest)  01/19/2025   DTaP/Tdap/Td (10 - Td or Tdap) 04/16/2029   HIV Screening  Completed   Pneumococcal Vaccine  Aged Out   Meningococcal B Vaccine  Aged Out    Immunizations: Immunization History  Administered Date(s) Administered   Dtap, Unspecified 03/24/1987, 06/03/1987, 08/06/1987, 02/18/1989, 06/18/1992   HIB, Unspecified 08/30/1988   Hep B, Unspecified 10/20/1998, 12/15/1998, 05/04/1999   Influenza Inj Mdck Quad Pf 10/28/2021   Influenza,inj,Quad PF,6+ Mos 10/25/2018   Influenza,inj,quad, With Preservative 11/09/2017   Influenza-Unspecified 09/19/2009, 10/03/2016, 11/09/2017   MMR 08/30/1988, 06/18/1992   Meningococcal Conjugate 05/05/2005   PFIZER(Purple Top)SARS-COV-2 Vaccination 09/03/2019, 09/24/2019   PPD Test 08/27/2013, 07/23/2014   Polio, Unspecified 03/24/1987, 06/03/1987, 08/06/1987, 02/18/1989   Td 04/27/2002   Tdap 07/14/2009, 05/24/2017, 04/17/2019   Vaccines:  HPV: up to at age 24 , ask insurance if age between 1-45  Shingrix: 72-64 yo and ask insurance if covered when patient above 2 yo Pneumonia: *** educated and discussed with patient. Flu: *** educated and discussed with patient. COVID:      ICD-10-CM   1. Well adult exam  Z00.00     2. Mood disorder (HCC)  F39     3. Moderate episode of recurrent major depressive disorder (HCC)  F33.1     4. Anxiety  F41.9     5. Attention deficit hyperactivity disorder (ADHD), predominantly inattentive type  F90.0           Michelene Cower, PA-C 08/19/23 2:29 PM  [facility]

## 2023-08-19 NOTE — Patient Instructions (Signed)
 Health Maintenance  Topic Date Due   Hepatitis C Screening  Never done   COVID-19 Vaccine (3 - 2024-25 season) 09/03/2023*   Flu Shot  04/03/2024*   HPV Vaccine (1 - 3-dose SCDM series) 08/17/2024*   Pap with HPV screening  01/19/2025   DTaP/Tdap/Td vaccine (10 - Td or Tdap) 04/16/2029   HIV Screening  Completed   Pneumococcal Vaccine  Aged Out   Meningitis B Vaccine  Aged Out  *Topic was postponed. The date shown is not the original due date.   We added on Hep C today  Do virtual follow up in three months for your next ADHD med refill  Let me know if you need lexapro  or wellbutrin  med doses adjusted - I will refill them the same for now.

## 2023-08-20 LAB — CBC WITH DIFFERENTIAL/PLATELET
Absolute Lymphocytes: 2419 {cells}/uL (ref 850–3900)
Absolute Monocytes: 605 {cells}/uL (ref 200–950)
Basophils Absolute: 63 {cells}/uL (ref 0–200)
Basophils Relative: 0.5 %
Eosinophils Absolute: 76 {cells}/uL (ref 15–500)
Eosinophils Relative: 0.6 %
HCT: 40.2 % (ref 35.0–45.0)
Hemoglobin: 13.3 g/dL (ref 11.7–15.5)
MCH: 30.3 pg (ref 27.0–33.0)
MCHC: 33.1 g/dL (ref 32.0–36.0)
MCV: 91.6 fL (ref 80.0–100.0)
MPV: 10.4 fL (ref 7.5–12.5)
Monocytes Relative: 4.8 %
Neutro Abs: 9437 {cells}/uL — ABNORMAL HIGH (ref 1500–7800)
Neutrophils Relative %: 74.9 %
Platelets: 395 Thousand/uL (ref 140–400)
RBC: 4.39 Million/uL (ref 3.80–5.10)
RDW: 11.8 % (ref 11.0–15.0)
Total Lymphocyte: 19.2 %
WBC: 12.6 Thousand/uL — ABNORMAL HIGH (ref 3.8–10.8)

## 2023-08-20 LAB — URINE CULTURE
MICRO NUMBER:: 16838418
Result:: NO GROWTH
SPECIMEN QUALITY:: ADEQUATE

## 2023-08-20 LAB — HEMOGLOBIN A1C
Hgb A1c MFr Bld: 5.3 % (ref <5.7)
Mean Plasma Glucose: 105 mg/dL
eAG (mmol/L): 5.8 mmol/L

## 2023-08-20 LAB — COMPREHENSIVE METABOLIC PANEL WITH GFR
AG Ratio: 1.7 (calc) (ref 1.0–2.5)
ALT: 15 U/L (ref 6–29)
AST: 18 U/L (ref 10–30)
Albumin: 4.3 g/dL (ref 3.6–5.1)
Alkaline phosphatase (APISO): 33 U/L (ref 31–125)
BUN: 22 mg/dL (ref 7–25)
CO2: 21 mmol/L (ref 20–32)
Calcium: 9.6 mg/dL (ref 8.6–10.2)
Chloride: 107 mmol/L (ref 98–110)
Creat: 0.92 mg/dL (ref 0.50–0.97)
Globulin: 2.5 g/dL (ref 1.9–3.7)
Glucose, Bld: 74 mg/dL (ref 65–99)
Potassium: 4.1 mmol/L (ref 3.5–5.3)
Sodium: 137 mmol/L (ref 135–146)
Total Bilirubin: 0.7 mg/dL (ref 0.2–1.2)
Total Protein: 6.8 g/dL (ref 6.1–8.1)
eGFR: 83 mL/min/1.73m2 (ref >=60)

## 2023-08-20 LAB — TSH: TSH: 0.43 m[IU]/L

## 2023-08-20 LAB — URINALYSIS, ROUTINE W REFLEX MICROSCOPIC
Bilirubin Urine: NEGATIVE
Glucose, UA: NEGATIVE
Hgb urine dipstick: NEGATIVE
Ketones, ur: NEGATIVE
Leukocytes,Ua: NEGATIVE
Nitrite: NEGATIVE
Protein, ur: NEGATIVE
Specific Gravity, Urine: 1.021 (ref 1.001–1.035)
pH: 5.5 (ref 5.0–8.0)

## 2023-08-20 LAB — LIPID PANEL
Cholesterol: 143 mg/dL (ref <200)
HDL: 59 mg/dL (ref >=50)
LDL Cholesterol (Calc): 67 mg/dL
Non-HDL Cholesterol (Calc): 84 mg/dL (ref <130)
Total CHOL/HDL Ratio: 2.4 (calc) (ref <5.0)
Triglycerides: 87 mg/dL (ref <150)

## 2023-08-20 LAB — HEPATITIS C ANTIBODY: Hepatitis C Ab: NONREACTIVE

## 2023-08-20 LAB — VITAMIN B12: Vitamin B-12: 284 pg/mL (ref 200–1100)

## 2023-08-23 ENCOUNTER — Ambulatory Visit: Payer: Self-pay | Admitting: Family Medicine

## 2023-08-23 DIAGNOSIS — E538 Deficiency of other specified B group vitamins: Secondary | ICD-10-CM

## 2023-08-23 MED ORDER — CYANOCOBALAMIN 1000 MCG/ML IJ SOLN
1000.0000 ug | INTRAMUSCULAR | 3 refills | Status: AC
Start: 2023-08-23 — End: ?

## 2023-10-07 ENCOUNTER — Other Ambulatory Visit: Payer: Self-pay | Admitting: Family Medicine

## 2023-10-07 DIAGNOSIS — F419 Anxiety disorder, unspecified: Secondary | ICD-10-CM

## 2023-10-07 DIAGNOSIS — F4323 Adjustment disorder with mixed anxiety and depressed mood: Secondary | ICD-10-CM

## 2023-10-07 NOTE — Telephone Encounter (Signed)
 Requested medications are due for refill today.  yes  Requested medications are on the active medications list.  yes  Last refill. 07/12/2023 #90 0 rf  Future visit scheduled.   yes  Notes to clinic.  Rx written to expire 09/24/2023 - rx is expired    Requested Prescriptions  Pending Prescriptions Disp Refills   escitalopram  (LEXAPRO ) 10 MG tablet [Pharmacy Med Name: ESCITALOPRAM  10 MG TABLET] 90 tablet 0    Sig: TAKE 1/2 TABLET DAILY BY MOUTH FOR 14 DAYS THEN TAKE 1 TABLET DAILY     Psychiatry:  Antidepressants - SSRI Passed - 10/07/2023  3:29 PM      Passed - Valid encounter within last 6 months    Recent Outpatient Visits           1 month ago Well adult exam   [facility] Leavy Mole, PA-C   3 months ago Encounter to establish care with new doctor   [facility] Leavy Mole, PA-C   4 months ago Dysuria   [facility] Leavy Mole, PA-C   6 months ago Attention deficit hyperactivity disorder (ADHD), predominantly inattentive type   [facility] Simmons-Robinson, [place], MD       Future Appointments             In 1 month Leavy Mole, PA-C [facility], [place]

## 2023-11-08 ENCOUNTER — Telehealth: Admitting: Family Medicine

## 2023-11-22 ENCOUNTER — Telehealth: Admitting: Nurse Practitioner

## 2023-11-22 DIAGNOSIS — G43101 Migraine with aura, not intractable, with status migrainosus: Secondary | ICD-10-CM

## 2023-11-22 DIAGNOSIS — F39 Unspecified mood [affective] disorder: Secondary | ICD-10-CM

## 2023-11-22 DIAGNOSIS — F900 Attention-deficit hyperactivity disorder, predominantly inattentive type: Secondary | ICD-10-CM | POA: Diagnosis not present

## 2023-11-22 DIAGNOSIS — M542 Cervicalgia: Secondary | ICD-10-CM | POA: Diagnosis not present

## 2023-11-22 DIAGNOSIS — F331 Major depressive disorder, recurrent, moderate: Secondary | ICD-10-CM | POA: Insufficient documentation

## 2023-11-22 MED ORDER — METHYLPHENIDATE HCL ER (OSM) 27 MG PO TBCR: 27.0000 mg | EXTENDED_RELEASE_TABLET | ORAL | 0 refills | Status: AC

## 2023-11-22 MED ORDER — TIZANIDINE HCL 4 MG PO TABS: 4.0000 mg | ORAL_TABLET | Freq: Four times a day (QID) | ORAL | 5 refills | Status: AC | PRN

## 2023-11-22 MED ORDER — RIZATRIPTAN BENZOATE 10 MG PO TBDP: 10.0000 mg | ORAL_TABLET | ORAL | 3 refills | Status: AC | PRN

## 2023-11-22 MED ORDER — BUPROPION HCL ER (XL) 150 MG PO TB24: 150.0000 mg | ORAL_TABLET | Freq: Every day | ORAL | 1 refills | Status: AC

## 2023-11-22 NOTE — Progress Notes (Signed)
 Name: Courtney Wyatt   MRN: [id number]    DOB: [birth year]   Date:11/22/2023       Progress Note  Subjective  Chief Complaint  Chief Complaint  Patient presents with   Medication Refill    I connected with  Courtney Wyatt  on 11/22/23 at 845 am by a video enabled telemedicine application and verified that I am speaking with the correct person using two identifiers.  I discussed the limitations of evaluation and management by telemedicine and the availability of in person appointments. The patient expressed understanding and agreed to proceed with a virtual visit  Staff also discussed with the patient that there may be a patient responsible charge related to this service. Patient Location: home Provider Location: [facility] Additional Individuals present: alone  HPI   Discussed the use of AI scribe software for clinical note transcription with the patient, who gave verbal consent to proceed.  History of Present Illness Courtney Wyatt is a 38 year old female who presents for medication refills.  Migraine headaches - Qulipta  60 mg daily for migraine prophylaxis - Maxalt  10 mg as needed for acute migraine episodes - Topamax  100 mg at bedtime  Depressive/anxiety symptoms - Wellbutrin  150 mg daily - Lexapro  10 mg daily - No longer taking Zoloft  -reports doing well on current treatment plan  Attention deficit symptoms - Methylphenidate  27 mg daily -doing well on current treatment plan  Urinary tract infection prophylaxis - Keflex  500 mg postcoital for prevention of urinary tract infections  Muscle spasms - Tizanidine  as needed for muscle spasms       11/22/2023    8:42 AM 08/19/2023    2:22 PM 06/13/2023    2:23 PM 03/14/2023    8:36 AM 07/22/2022    2:49 PM  Depression screen PHQ 2/9  Decreased Interest 1 1 3  0 0  Down, Depressed, Hopeless 3 1 3 3  0  PHQ - 2 Score 4 2 6 3  0  Altered sleeping 3 2 3 3 1   Tired, decreased energy 3 2 3 3 1   Change in appetite 1  3 2 1   Feeling  bad or failure about yourself  3 2 3 3  0  Trouble concentrating 3 3 3 3 1   Moving slowly or fidgety/restless 0 0 0 2 0  Suicidal thoughts 0 0 0 0 0  PHQ-9 Score 17 11  21  19  4    Difficult doing work/chores    Somewhat difficult Not difficult at all     Data saved with a previous flowsheet row definition       11/22/2023    8:44 AM 08/19/2023    2:23 PM 06/13/2023    2:24 PM 03/14/2023    8:37 AM  GAD 7 : Generalized Anxiety Score  Nervous, Anxious, on Edge 3 3 3 3   Control/stop worrying 3 2 3 2   Worry too much - different things 3 2 3 2   Trouble relaxing 1 2 3 3   Restless 1 1 3 2   Easily annoyed or irritable 3 3 3 3   Afraid - awful might happen 1 1 3 1   Total GAD 7 Score 15 14 21 16      Patient Active Problem List   Diagnosis Date Noted   Moderate episode of recurrent major depressive disorder (HCC) 11/22/2023   Migraine with aura and with status migrainosus, not intractable 11/25/2022   Mood disorder 07/22/2022   Neck pain without injury 07/22/2022   Excessive sweating 01/19/2022  Attention deficit hyperactivity disorder (ADHD) 12/16/2020   Anxiety 07/24/2015    Social History   Tobacco Use   Smoking status: Former    Current packs/day: 0.00    Average packs/day: 0.5 packs/day for 5.0 years (2.5 ttl pk-yrs)    Types: Cigarettes    Start date: 10/21/2010    Quit date: 10/21/2015    Years since quitting: 8.0   Smokeless tobacco: Never  Substance Use Topics   Alcohol use: Not Currently    Comment: occ     Current Outpatient Medications:    Atogepant  (QULIPTA ) 60 MG TABS, Take 1 tablet (60 mg total) by mouth daily., Disp: 90 tablet, Rfl: 3   cephALEXin  (KEFLEX ) 500 MG capsule, Take 1 capsule (500 mg total) by mouth daily as needed (post coital dose for preventing UTI)., Disp: 20 capsule, Rfl: 5   cyanocobalamin  (VITAMIN B12) 1000 MCG/ML injection, Inject 1 mL (1,000 mcg total) into the muscle every 30 (thirty) days., Disp: 3 mL, Rfl: 3   escitalopram  (LEXAPRO ) 10  MG tablet, TAKE 1/2 TABLET DAILY BY MOUTH FOR 14 DAYS THEN TAKE 1 TABLET DAILY, Disp: 90 tablet, Rfl: 0   JUNEL FE 1/20 1-20 MG-MCG tablet, Take 1 tablet by mouth daily., Disp: , Rfl:    magnesium oxide (MAG-OX) 400 (240 Mg) MG tablet, Take 400 mg by mouth daily., Disp: , Rfl:    ondansetron  (ZOFRAN ) 4 MG tablet, Take 1 tablet (4 mg total) by mouth every 8 (eight) hours as needed for nausea or vomiting., Disp: 20 tablet, Rfl: 6   topiramate  (TOPAMAX ) 100 MG tablet, Take 1 tablet (100 mg total) by mouth at bedtime., Disp: 90 tablet, Rfl: 3   buPROPion  (WELLBUTRIN  XL) 150 MG 24 hr tablet, Take 1 tablet (150 mg total) by mouth daily., Disp: 90 tablet, Rfl: 1   methylphenidate  27 MG PO CR tablet, Take 1 tablet (27 mg total) by mouth every morning., Disp: 90 tablet, Rfl: 0   rizatriptan  (MAXALT -MLT) 10 MG disintegrating tablet, Take 1 tablet (10 mg total) by mouth as needed for migraine. May repeat in 2 hours if needed, Disp: 10 tablet, Rfl: 3   tiZANidine  (ZANAFLEX ) 4 MG tablet, Take 1 tablet (4 mg total) by mouth every 6 (six) hours as needed for muscle spasms., Disp: 30 tablet, Rfl: 5  No Known Allergies  I personally reviewed active problem list, medication list, allergies, notes from last encounter, lab results with the patient/caregiver today.  ROS  Constitutional: Negative for fever or weight change.  Respiratory: Negative for cough and shortness of breath.   Cardiovascular: Negative for chest pain or palpitations.  Gastrointestinal: Negative for abdominal pain, no bowel changes.  Musculoskeletal: Negative for gait problem or joint swelling.  Skin: Negative for rash.  Neurological: Negative for dizziness or headache.  No other specific complaints in a complete review of systems (except as listed in HPI above).   Objective  Virtual encounter, vitals not obtained.  There is no height or weight on file to calculate BMI.  Nursing Note and Vital Signs reviewed.  Physical Exam  Awake,  alert and oriented, speaking in complete sentences   No results found for this or any previous visit (from the past 72 hours).  Assessment & Plan  Assessment and Plan Assessment & Plan Attention-deficit hyperactivity disorder, predominantly inattentive type ADHD is managed with methylphenidate  27 mg daily. - Refilled methylphenidate  27 mg daily.  Migraine with aura Migraines are managed with MagSalt 10 mg as needed. - Refilled Maxalt  10  mg as needed.  Depression/anxiety Managed with Wellbutrin  150 mg daily and Lexapro  10 mg daily. Zoloft  is no longer being used. - Refilled Wellbutrin  150 mg daily. - Continue Lexapro  10 mg daily. PHQ9 and GAD scores are high, patient reports she is stable on current treatment plan  Cervicalgia Managed with tizanidine  as needed for muscle spasms. - Refilled tizanidine  as needed.      -Red flags and when to present for emergency care or RTC including fever >101.52F, chest pain, shortness of breath, new/worsening/un-resolving symptoms,  reviewed with patient at time of visit. Follow up and care instructions discussed and provided in AVS. - I discussed the assessment and treatment plan with the patient. The patient was provided an opportunity to ask questions and all were answered. The patient agreed with the plan and demonstrated an understanding of the instructions.  I provided 20 minutes of non-face-to-face time during this encounter.  Mliss JULIANNA Spray, [name]

## 2023-12-31 ENCOUNTER — Other Ambulatory Visit: Payer: Self-pay | Admitting: Family Medicine

## 2023-12-31 DIAGNOSIS — F4323 Adjustment disorder with mixed anxiety and depressed mood: Secondary | ICD-10-CM

## 2023-12-31 DIAGNOSIS — F419 Anxiety disorder, unspecified: Secondary | ICD-10-CM

## 2024-01-03 NOTE — Telephone Encounter (Signed)
 Requested Prescriptions  Pending Prescriptions Disp Refills   escitalopram  (LEXAPRO ) 10 MG tablet [Pharmacy Med Name: ESCITALOPRAM  10 MG TABLET] 90 tablet 0    Sig: TAKE 1/2 TABLET DAILY BY MOUTH FOR 14 DAYS THEN TAKE 1 TABLET DAILY     Psychiatry:  Antidepressants - SSRI Passed - 01/03/2024 10:46 AM      Passed - Completed PHQ-2 or PHQ-9 in the last 360 days      Passed - Valid encounter within last 6 months    Recent Outpatient Visits           1 month ago Attention deficit hyperactivity disorder (ADHD), predominantly inattentive type   [facility] Gareth Mliss FALCON, [name]   4 months ago Well adult exam   [facility] Leavy Mole, PA-C   6 months ago Encounter to establish care with new doctor   [facility] Leavy Mole, PA-C   7 months ago Dysuria   [facility] Leavy Mole, PA-C   9 months ago Attention deficit hyperactivity disorder (ADHD), predominantly inattentive type   [facility] Sharma Coyer, MD
